# Patient Record
Sex: Female | Born: 1960 | Hispanic: No | Marital: Married | Smoking: Never smoker
Health system: Southern US, Community
[De-identification: ages and names within clinical notes are randomized; demographics above are authoritative.]

## PROBLEM LIST (undated history)

## (undated) DIAGNOSIS — I10 Essential (primary) hypertension: Secondary | ICD-10-CM

## (undated) DIAGNOSIS — E119 Type 2 diabetes mellitus without complications: Secondary | ICD-10-CM

---

## 2002-04-27 ENCOUNTER — Encounter: Payer: Self-pay | Admitting: *Deleted

## 2002-04-27 ENCOUNTER — Encounter: Admission: RE | Admit: 2002-04-27 | Discharge: 2002-04-27 | Payer: Self-pay | Admitting: *Deleted

## 2002-05-05 ENCOUNTER — Encounter: Admission: RE | Admit: 2002-05-05 | Discharge: 2002-05-05 | Payer: Self-pay | Admitting: *Deleted

## 2002-05-05 ENCOUNTER — Encounter: Payer: Self-pay | Admitting: *Deleted

## 2003-09-25 ENCOUNTER — Other Ambulatory Visit: Admission: RE | Admit: 2003-09-25 | Discharge: 2003-09-25 | Payer: Self-pay | Admitting: Family Medicine

## 2003-09-28 ENCOUNTER — Emergency Department (HOSPITAL_COMMUNITY): Admission: EM | Admit: 2003-09-28 | Discharge: 2003-09-28 | Payer: Self-pay | Admitting: Emergency Medicine

## 2003-10-08 ENCOUNTER — Ambulatory Visit (HOSPITAL_COMMUNITY): Admission: RE | Admit: 2003-10-08 | Discharge: 2003-10-08 | Payer: Self-pay | Admitting: Family Medicine

## 2003-10-10 ENCOUNTER — Encounter: Admission: RE | Admit: 2003-10-10 | Discharge: 2003-10-10 | Payer: Self-pay | Admitting: Surgery

## 2003-11-05 ENCOUNTER — Observation Stay (HOSPITAL_COMMUNITY): Admission: RE | Admit: 2003-11-05 | Discharge: 2003-11-06 | Payer: Self-pay | Admitting: General Surgery

## 2003-11-05 ENCOUNTER — Encounter (INDEPENDENT_AMBULATORY_CARE_PROVIDER_SITE_OTHER): Payer: Self-pay | Admitting: Specialist

## 2003-12-14 ENCOUNTER — Encounter: Admission: RE | Admit: 2003-12-14 | Discharge: 2003-12-14 | Payer: Self-pay | Admitting: Family Medicine

## 2004-07-08 ENCOUNTER — Ambulatory Visit: Payer: Self-pay | Admitting: Family Medicine

## 2004-07-29 ENCOUNTER — Encounter (INDEPENDENT_AMBULATORY_CARE_PROVIDER_SITE_OTHER): Payer: Self-pay | Admitting: *Deleted

## 2004-07-29 ENCOUNTER — Inpatient Hospital Stay (HOSPITAL_COMMUNITY): Admission: RE | Admit: 2004-07-29 | Discharge: 2004-08-01 | Payer: Self-pay | Admitting: Urology

## 2004-10-27 ENCOUNTER — Ambulatory Visit: Payer: Self-pay | Admitting: Family Medicine

## 2004-11-03 ENCOUNTER — Ambulatory Visit: Payer: Self-pay | Admitting: Family Medicine

## 2004-12-30 ENCOUNTER — Ambulatory Visit: Payer: Self-pay | Admitting: Family Medicine

## 2005-01-06 ENCOUNTER — Ambulatory Visit: Payer: Self-pay | Admitting: Family Medicine

## 2005-07-06 ENCOUNTER — Ambulatory Visit: Payer: Self-pay | Admitting: Family Medicine

## 2005-09-02 ENCOUNTER — Encounter: Admission: RE | Admit: 2005-09-02 | Discharge: 2005-09-02 | Payer: Self-pay | Admitting: Rheumatology

## 2005-10-16 ENCOUNTER — Ambulatory Visit: Payer: Self-pay | Admitting: Family Medicine

## 2005-10-19 ENCOUNTER — Encounter: Admission: RE | Admit: 2005-10-19 | Discharge: 2005-10-19 | Payer: Self-pay | Admitting: Neurosurgery

## 2005-11-09 ENCOUNTER — Encounter: Admission: RE | Admit: 2005-11-09 | Discharge: 2005-11-09 | Payer: Self-pay | Admitting: Neurosurgery

## 2006-02-12 ENCOUNTER — Encounter: Admission: RE | Admit: 2006-02-12 | Discharge: 2006-02-12 | Payer: Self-pay | Admitting: Family Medicine

## 2006-05-17 ENCOUNTER — Encounter: Admission: RE | Admit: 2006-05-17 | Discharge: 2006-05-17 | Payer: Self-pay | Admitting: Neurosurgery

## 2006-06-11 ENCOUNTER — Ambulatory Visit: Payer: Self-pay | Admitting: Internal Medicine

## 2006-06-18 ENCOUNTER — Ambulatory Visit: Payer: Self-pay | Admitting: Family Medicine

## 2006-09-28 ENCOUNTER — Ambulatory Visit: Payer: Self-pay | Admitting: Family Medicine

## 2007-01-18 ENCOUNTER — Ambulatory Visit: Payer: Self-pay | Admitting: Family Medicine

## 2007-02-21 ENCOUNTER — Encounter: Admission: RE | Admit: 2007-02-21 | Discharge: 2007-02-21 | Payer: Self-pay | Admitting: Family Medicine

## 2007-06-06 DIAGNOSIS — I10 Essential (primary) hypertension: Secondary | ICD-10-CM | POA: Insufficient documentation

## 2007-06-06 DIAGNOSIS — F329 Major depressive disorder, single episode, unspecified: Secondary | ICD-10-CM | POA: Insufficient documentation

## 2007-06-06 DIAGNOSIS — R32 Unspecified urinary incontinence: Secondary | ICD-10-CM | POA: Insufficient documentation

## 2007-07-13 ENCOUNTER — Ambulatory Visit: Payer: Self-pay | Admitting: Family Medicine

## 2007-07-13 LAB — CONVERTED CEMR LAB
AST: 31 units/L (ref 0–37)
Albumin: 4.1 g/dL (ref 3.5–5.2)
Basophils Absolute: 0 10*3/uL (ref 0.0–0.1)
Blood in Urine, dipstick: NEGATIVE
Chloride: 102 meq/L (ref 96–112)
Cholesterol: 188 mg/dL (ref 0–200)
Eosinophils Absolute: 0 10*3/uL (ref 0.0–0.6)
GFR calc non Af Amer: 72 mL/min
Glucose, Urine, Semiquant: NEGATIVE
HCT: 41.1 % (ref 36.0–46.0)
HDL: 45.7 mg/dL (ref >39.0)
MCHC: 33.8 g/dL (ref 30.0–36.0)
MCV: 91.7 fL (ref 78.0–100.0)
Neutrophils Relative %: 66.8 % (ref 43.0–77.0)
Platelets: 249 10*3/uL (ref 150–400)
Protein, U semiquant: NEGATIVE
RBC: 4.48 M/uL (ref 3.87–5.11)
Sodium: 143 meq/L (ref 135–145)
TSH: 0.8 microintl units/mL (ref 0.35–5.50)
Total CHOL/HDL Ratio: 4.1
Triglycerides: 97 mg/dL (ref 0–149)
pH: 6

## 2007-07-20 ENCOUNTER — Ambulatory Visit: Payer: Self-pay | Admitting: Family Medicine

## 2007-07-20 DIAGNOSIS — E782 Mixed hyperlipidemia: Secondary | ICD-10-CM | POA: Insufficient documentation

## 2007-08-17 ENCOUNTER — Telehealth (INDEPENDENT_AMBULATORY_CARE_PROVIDER_SITE_OTHER): Payer: Self-pay | Admitting: *Deleted

## 2007-08-18 ENCOUNTER — Ambulatory Visit: Payer: Self-pay | Admitting: Family Medicine

## 2007-08-18 DIAGNOSIS — J309 Allergic rhinitis, unspecified: Secondary | ICD-10-CM | POA: Insufficient documentation

## 2007-09-30 ENCOUNTER — Telehealth (INDEPENDENT_AMBULATORY_CARE_PROVIDER_SITE_OTHER): Payer: Self-pay | Admitting: *Deleted

## 2007-11-14 ENCOUNTER — Observation Stay (HOSPITAL_COMMUNITY): Admission: EM | Admit: 2007-11-14 | Discharge: 2007-11-15 | Payer: Self-pay | Admitting: Emergency Medicine

## 2007-11-14 ENCOUNTER — Ambulatory Visit: Payer: Self-pay | Admitting: Infectious Diseases

## 2007-11-15 ENCOUNTER — Ambulatory Visit: Payer: Self-pay | Admitting: Internal Medicine

## 2007-11-17 ENCOUNTER — Encounter: Payer: Self-pay | Admitting: Family Medicine

## 2007-11-17 ENCOUNTER — Ambulatory Visit: Payer: Self-pay

## 2007-11-24 ENCOUNTER — Telehealth: Payer: Self-pay | Admitting: Family Medicine

## 2007-12-07 ENCOUNTER — Telehealth: Payer: Self-pay | Admitting: Family Medicine

## 2007-12-26 ENCOUNTER — Telehealth: Payer: Self-pay | Admitting: Family Medicine

## 2008-02-01 ENCOUNTER — Encounter: Admission: RE | Admit: 2008-02-01 | Discharge: 2008-02-01 | Payer: Self-pay | Admitting: Neurosurgery

## 2008-03-14 ENCOUNTER — Encounter: Admission: RE | Admit: 2008-03-14 | Discharge: 2008-03-14 | Payer: Self-pay | Admitting: Family Medicine

## 2008-04-06 ENCOUNTER — Encounter: Admission: RE | Admit: 2008-04-06 | Discharge: 2008-04-06 | Payer: Self-pay | Admitting: Neurosurgery

## 2009-03-26 ENCOUNTER — Encounter: Admission: RE | Admit: 2009-03-26 | Discharge: 2009-03-26 | Payer: Self-pay | Admitting: Internal Medicine

## 2009-03-30 ENCOUNTER — Ambulatory Visit: Payer: Self-pay | Admitting: Diagnostic Radiology

## 2009-03-30 ENCOUNTER — Emergency Department (HOSPITAL_BASED_OUTPATIENT_CLINIC_OR_DEPARTMENT_OTHER): Admission: EM | Admit: 2009-03-30 | Discharge: 2009-03-30 | Payer: Self-pay | Admitting: Emergency Medicine

## 2009-12-03 ENCOUNTER — Encounter: Admission: RE | Admit: 2009-12-03 | Discharge: 2009-12-03 | Payer: Self-pay | Admitting: Neurosurgery

## 2010-03-01 IMAGING — MG MM DIGITAL SCREENING BILAT W/ CAD
4 series · 4 of 4 positions shown · non-contrast
Comparison: Prior studies.

DG SCREEN MAMMOGRAM BILATERAL
Bilateral CC and MLO view(s) were taken.
Technologist: Dominika Obregon

DIGITAL SCREENING MAMMOGRAM WITH CAD:

[R CC]
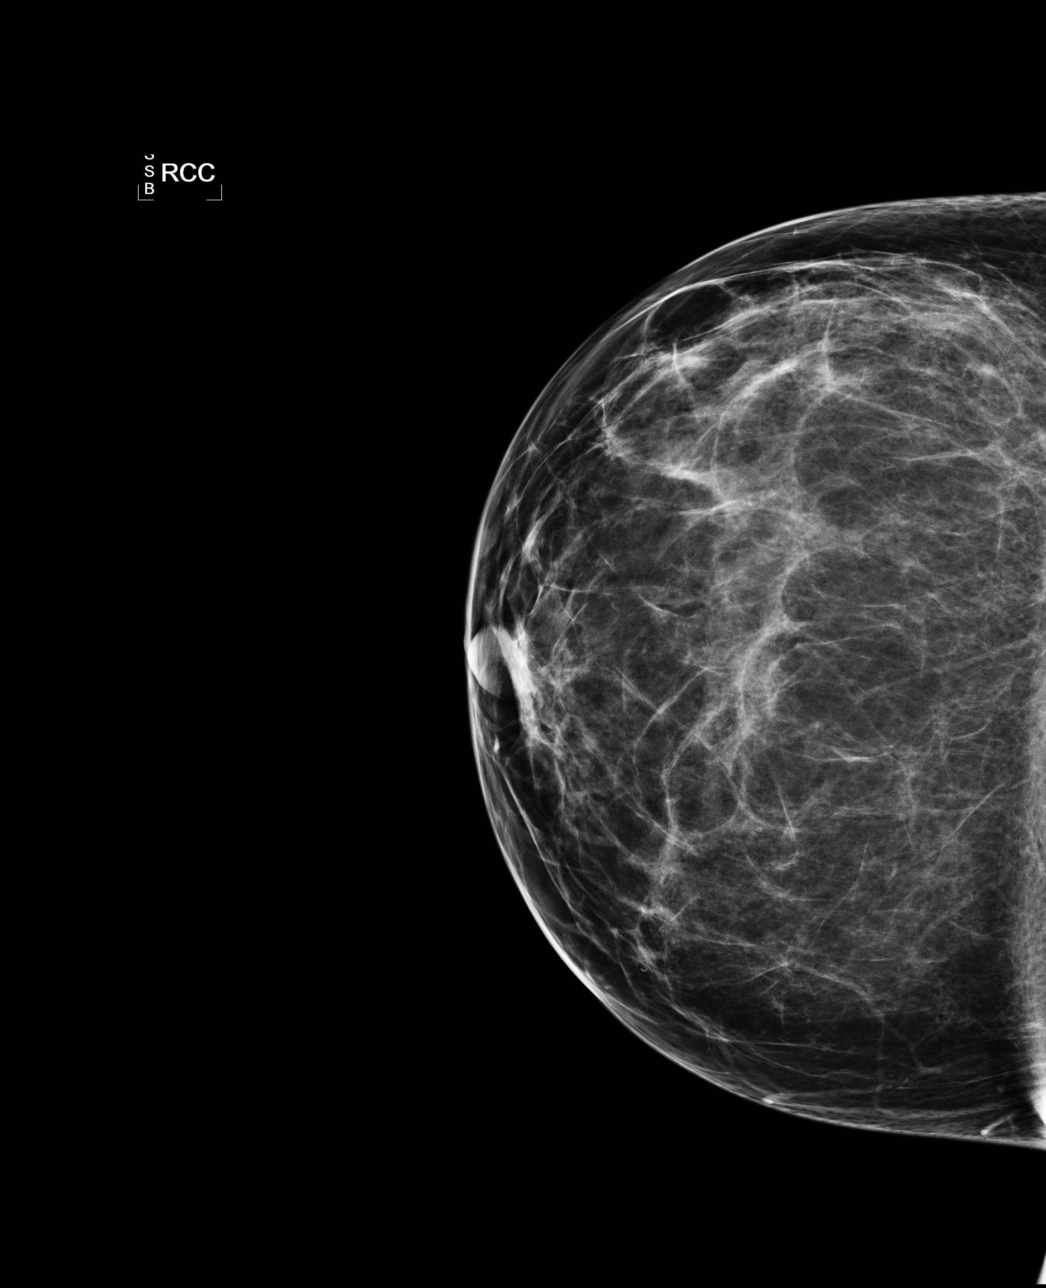

[L CC]
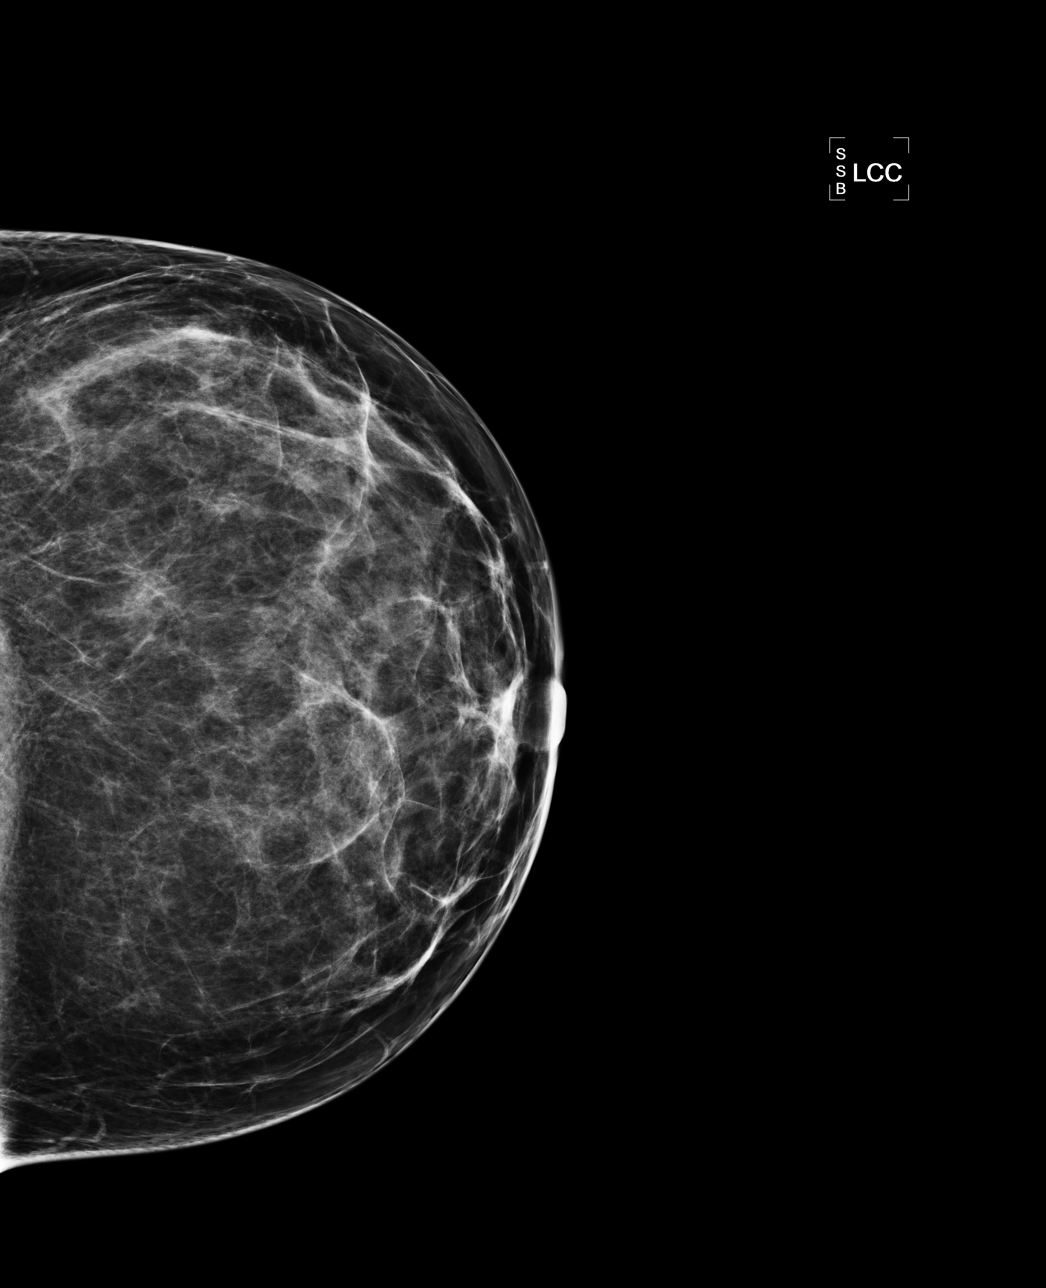

[L MLO]
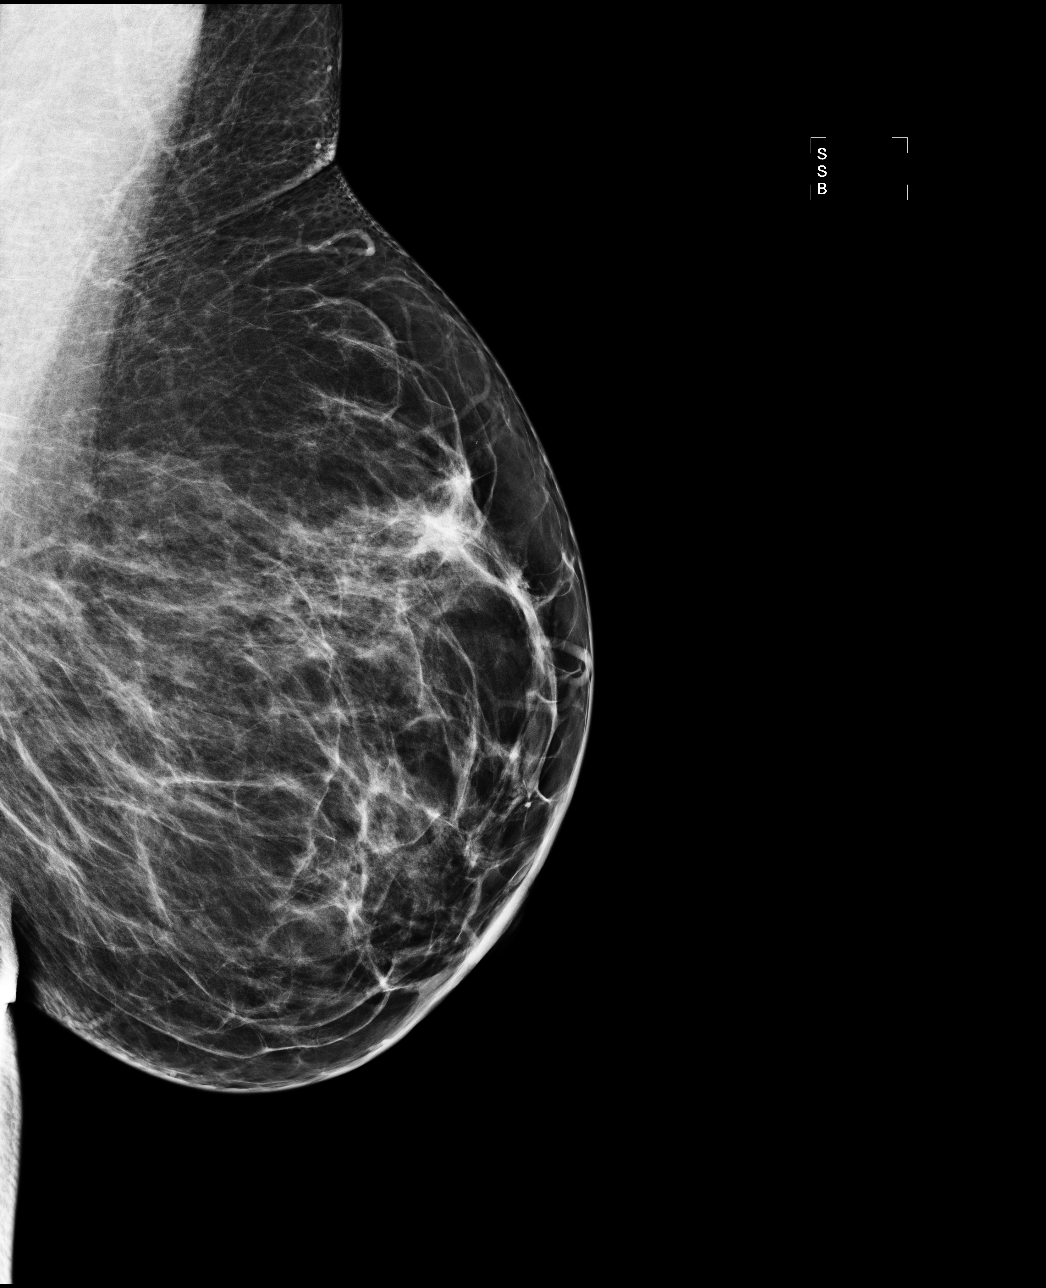

[R MLO]
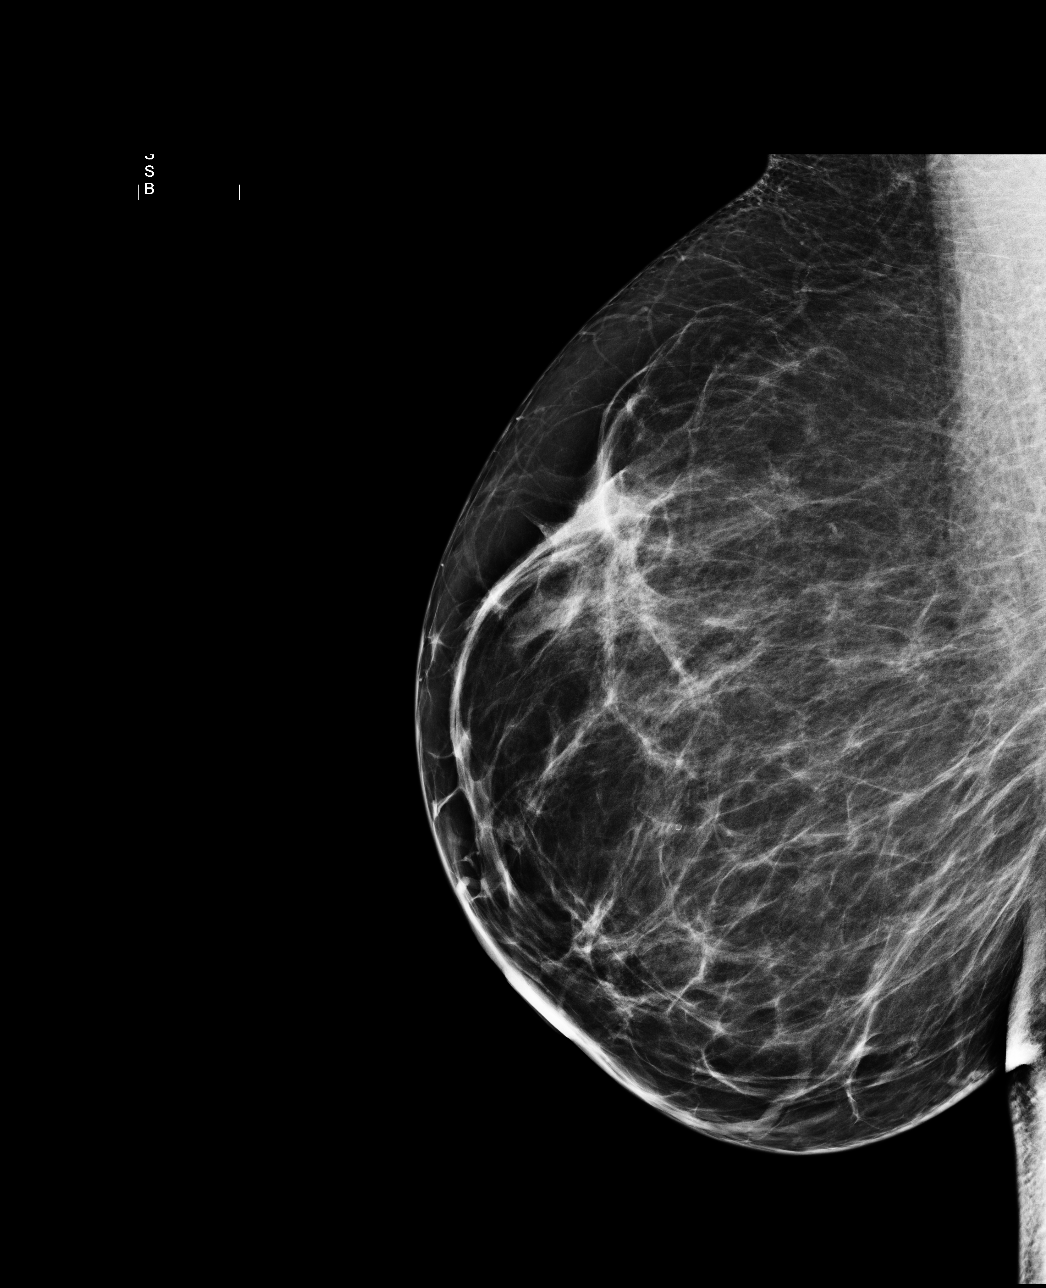

[4 of 4 positions shown; findings below may reference images not displayed]

There are scattered fibroglandular densities.  There is no dominant mass, architectural distortion 
or calcification to suggest malignancy.

Images were processed with CAD.
IMPRESSION: No mammographic evidence of malignancy.  Suggest yearly screening mammography.

A result letter of this screening mammogram will be mailed directly to the patient.

ASSESSMENT: Negative - BI-RADS 1

Screening mammogram in 1 year.
ANALYZED BY COMPUTER AIDED DETECTION , THIS PROCEDURE WAS A DIGITAL MAMMOGRAM.

## 2010-03-05 IMAGING — CR DG CHEST 2V
2 series · 2 of 2 positions shown · non-contrast
Comparison: 11/13/2007

CLINICAL DATA: Abdominal pain.  Fever.  Nausea and vomiting.
Hypertension.

CHEST - 2 VIEW

[w chest pa]
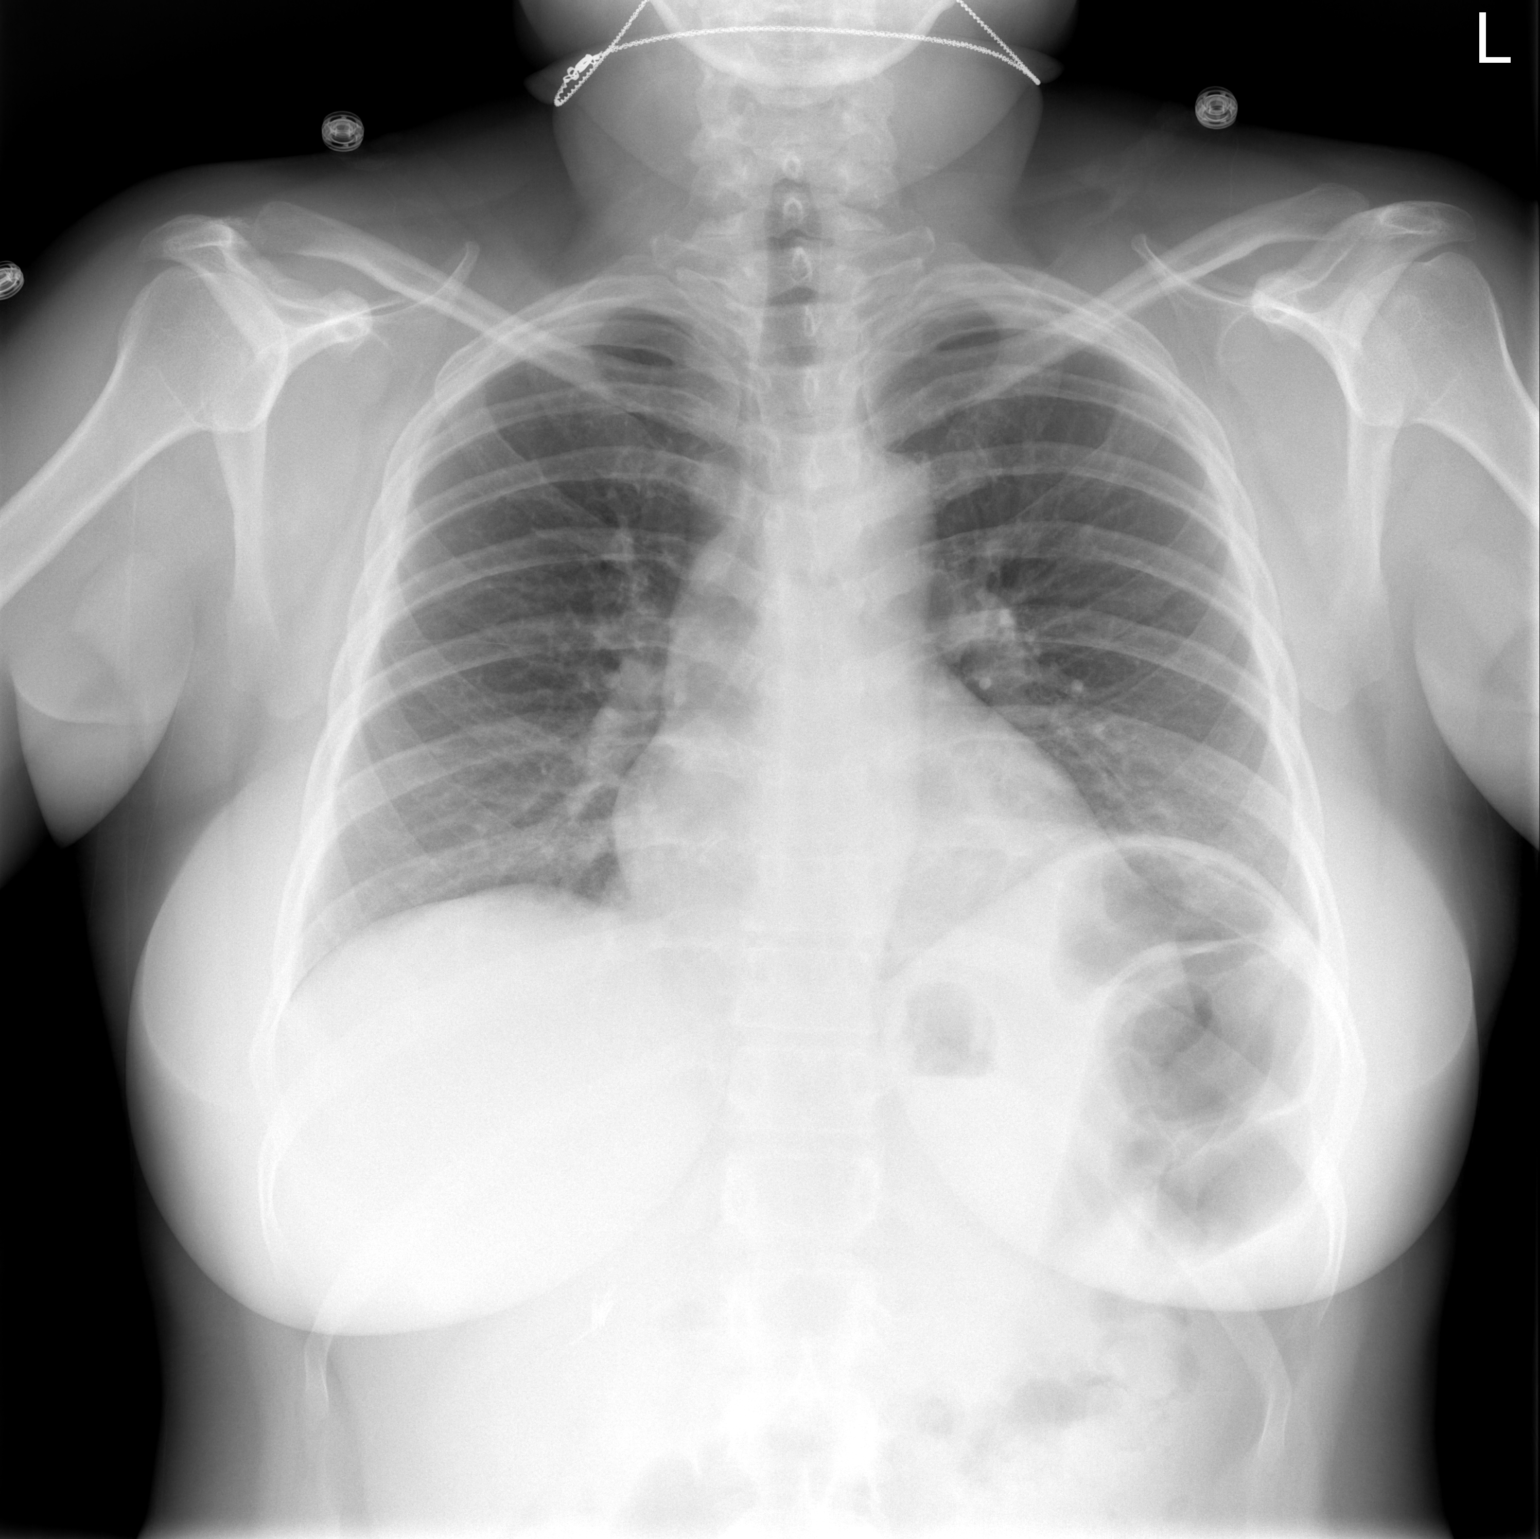

[w chest lat]
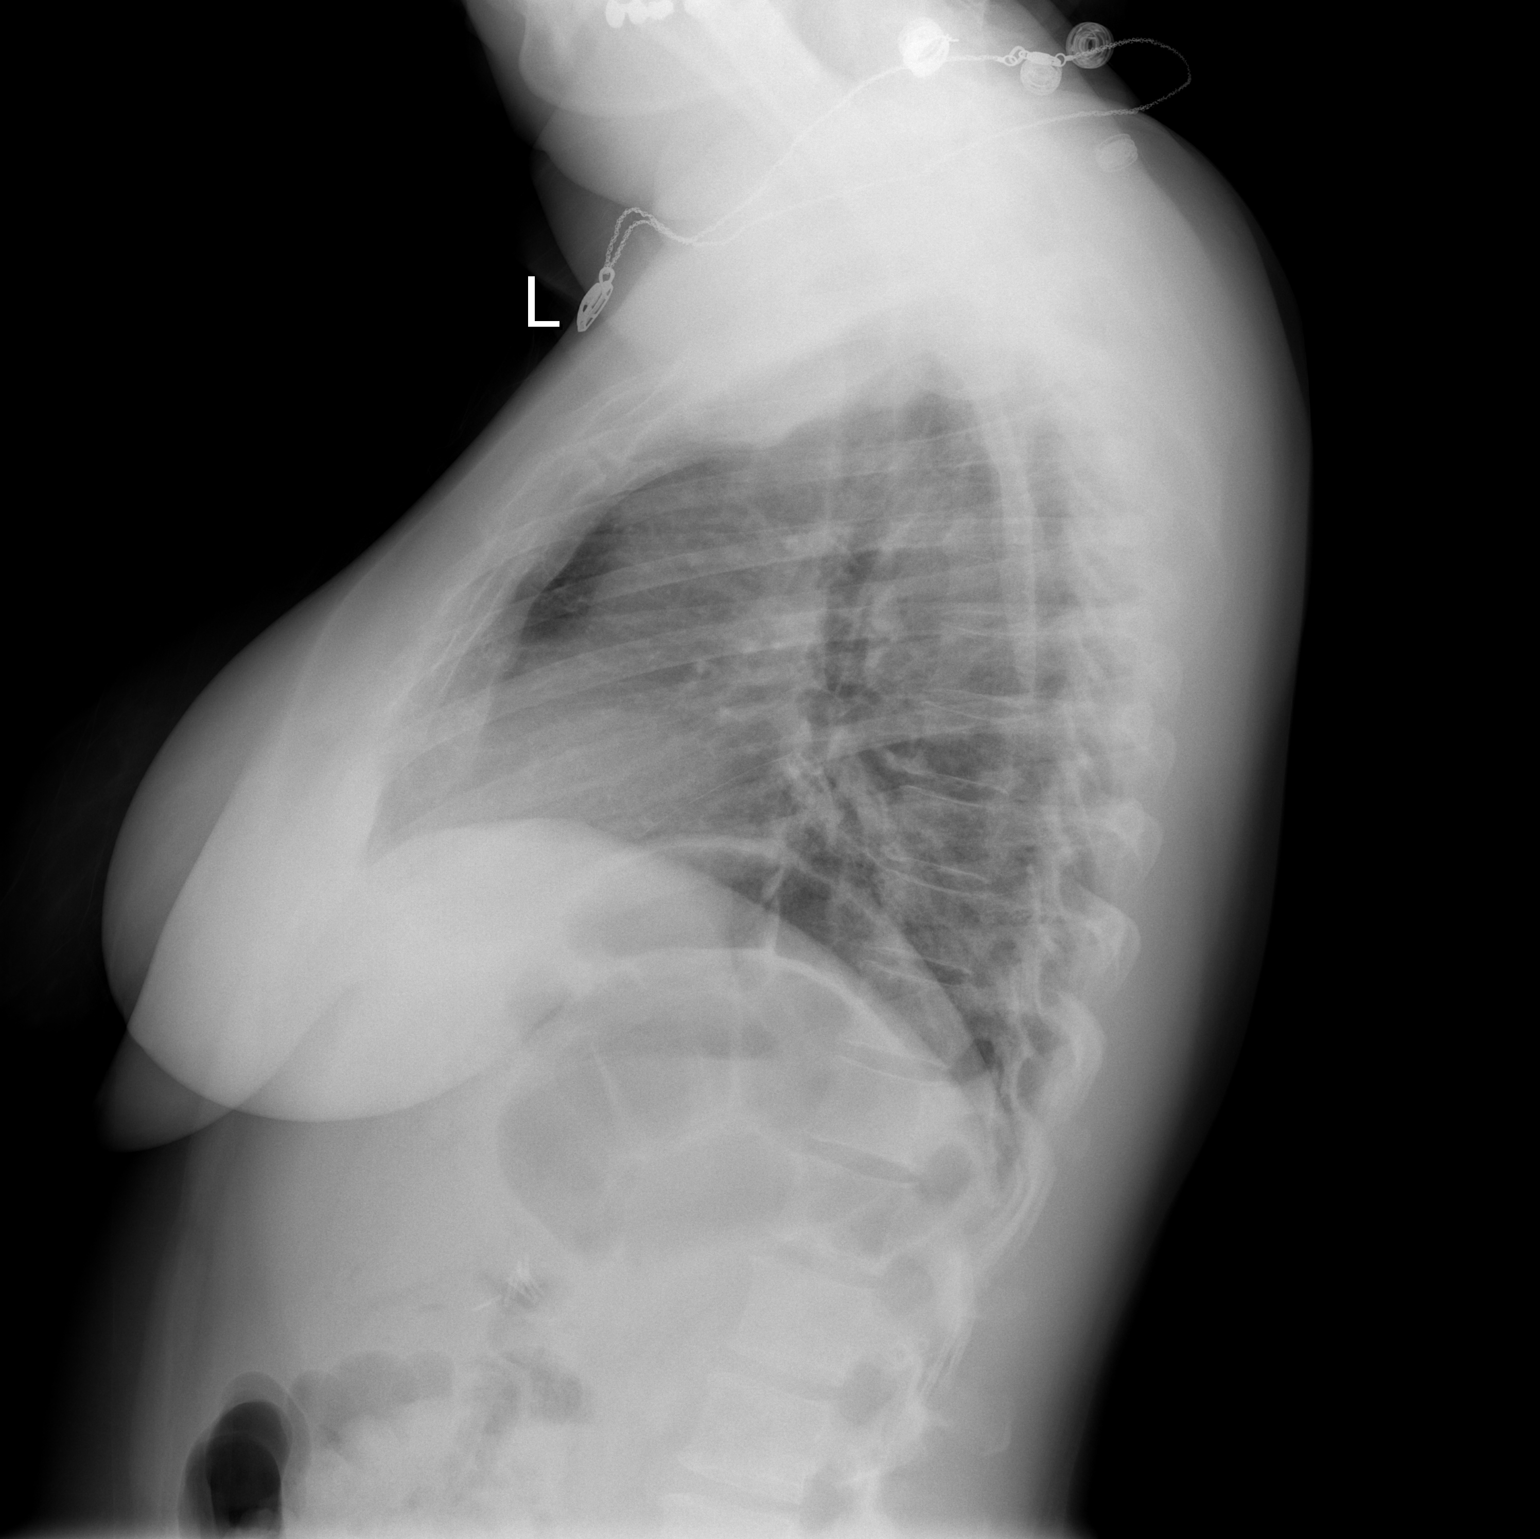

[2 of 2 positions shown; findings below may reference images not displayed]

FINDINGS: Heart size is normal.  There are slightly increased
chronic markings at the lung bases that appear the same as were
seen previously.  No acute infiltrate, mass, effusion or collapse.
Mild inflammation could be hidden amongst the chronic changes at
the bases.
IMPRESSION: Chronically prominent basilar lung markings.  No definite acute
process.  Mild pneumonia could be hidden amongst these chronic
markings at the bases.

## 2010-09-27 ENCOUNTER — Encounter: Payer: Self-pay | Admitting: Family Medicine

## 2010-09-27 ENCOUNTER — Encounter: Payer: Self-pay | Admitting: Obstetrics and Gynecology

## 2010-12-14 LAB — CBC
HCT: 39.7 % (ref 36.0–46.0)
Hemoglobin: 12.2 g/dL (ref 12.0–15.0)
WBC: 5.6 10*3/uL (ref 4.0–10.5)

## 2010-12-14 LAB — URINALYSIS, ROUTINE W REFLEX MICROSCOPIC
Nitrite: POSITIVE — AB
Specific Gravity, Urine: 1.016 (ref 1.005–1.030)
Urobilinogen, UA: 4 mg/dL — ABNORMAL HIGH (ref 0.0–1.0)

## 2010-12-14 LAB — DIFFERENTIAL
Eosinophils Relative: 0 % (ref 0–5)
Lymphocytes Relative: 4 % — ABNORMAL LOW (ref 12–46)
Lymphs Abs: 0.2 10*3/uL — ABNORMAL LOW (ref 0.7–4.0)
Monocytes Absolute: 0 10*3/uL — ABNORMAL LOW (ref 0.1–1.0)
Neutro Abs: 5.4 10*3/uL (ref 1.7–7.7)

## 2010-12-14 LAB — URINE CULTURE

## 2010-12-14 LAB — URINE MICROSCOPIC-ADD ON

## 2010-12-14 LAB — BASIC METABOLIC PANEL
Calcium: 8.9 mg/dL (ref 8.4–10.5)
GFR calc Af Amer: >60 mL/min (ref >60)
GFR calc non Af Amer: >60 mL/min (ref >60)
Potassium: 4.2 mEq/L (ref 3.5–5.1)
Sodium: 140 mEq/L (ref 135–145)

## 2010-12-14 LAB — PREGNANCY, URINE: Preg Test, Ur: NEGATIVE

## 2011-01-20 NOTE — H&P (Signed)
NAME:  [name], [name] NO.:  [id number]   MEDICAL RECORD NO.:  [id number]          PATIENT TYPE:  EMS   LOCATION:  MAJO                         FACILITY:  [facility]   PHYSICIAN:  Mick Sell, MD DATE OF BIRTH:  1960-11-11   DATE OF ADMISSION:  11/14/2007  DATE OF DISCHARGE:                              HISTORY & PHYSICAL   CHIEF COMPLAINT:  Chest pain.   HISTORY OF PRESENT ILLNESS:  This is a pleasant 50 year old female with  a history of hypertension, hyperlipidemia, GERD, depression, as well as  fibromyalgia, who presented via EMS to the hospital after having onset  of severe 10/10 substernal chest pain around 9 p.m. this evening.  She  said the pain occurred while she was getting ready for bed, but not  necessarily associated with exertion.  She described it as burning right  in the middle of her chest with some right arm weakness and numbness.  She has had some associated nausea, vomited once and was diaphoretic and  clammy.  She initially thought it was GERD and took some of her  medications including Pepto-Bismol without relief.  She called EMS and  was given nitroglycerin x2 in the ambulance with full relief of the  pain.  She has not had any palpitations or any severe shortness of  breath associated with it.  She has no history of lower extremity  swelling or tenderness.  No fevers, chills or cough.   PAST MEDICAL HISTORY:  1. Hypertension.  2. Hypercholesterolemia.  3. Fibromyalgia.  4. GERD.  5. History of duodenal ulcer 19 years ago.   ALLERGIES:  No known drug allergies.   MEDICATIONS:  1. Atenolol 50 once a day.  2. Detrol 4 once a day.  3. Zoloft 100 once a day.  4. Simvastatin she thinks 40 once a day.  5. Lyrica 150 once a day.  6. Aspirin 81 mg once a day.  7. Prilosec OTC.   SOCIAL HISTORY:  The patient is married.  Lives with her husband and  child.  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  She is a  nonsmoker.  She drinks about two glasses of wine a day.   FAMILY HISTORY:  Negative for early coronary disease.  Her father died  of pancreatic cancer, and her mother is alive and well at 46.   PHYSICAL EXAMINATION:  GENERAL:  The patient is a slightly overweight  female in no acute distress.  Although, she does appear tired.  VITAL SIGNS:  Temperature 97.5, pulse 68, blood pressure 124/80,  respirations 18, saturation 98% on room air.  HEENT:  Pupils equal, round and reactive to light and accommodation.  Extraocular movements intact.  Sclerae anicteric.  Oropharynx clear.  NECK:  Supple.  HEART:  Regular rate and rhythm.  LUNGS:  Clear to auscultation.  ABDOMEN:  Soft, nontender, nondistended, no hepatosplenomegaly.  EXTREMITIES:  No cyanosis, clubbing or edema.  No tenderness.  Negative  Homan's sign.  NEUROLOGICAL:  Alert and oriented x3 with a grossly nonfocal  neurological exam.   LABORATORY DATA:  The patient had blood work done in the ER with sodium  135, potassium 3.1, chloride 102, BUN 13, creatinine 0.9.  Troponin less  than 0.05 x2.  CK MB of less than 1 x2, myoglobin 25.7 and 31.2.  The  patient also had an EKG done which showed normal sinus rhythm with no ST-  T wave changes.  Chest x-ray showed low lung volumes with  cardiopulmonary vasculature and mild bibasilar subsegmental atelectasis,  otherwise, negative.   ASSESSMENT:  1. This is a 50 year old with chest pain who has risk factors of      hypercholesterolemia, hypertension who had chest pain that was      burning in nature, severe, associated with nausea, vomiting,      diaphoresis and was relieved with sublingual nitroglycerin x2 via      EMS.  Given her risk factors and her somewhat atypical chest pain      syndrome, will admit her for rule out MI.  She has had two negative      sets of enzymes so far.  Will repeat another one and consult      cardiology for further evaluation with probable Myoview stress       test.  2. Will also treat her for her history of GERD and prior history of      duodenal ulcers with Nexium as well as Maalox and GI cocktail as      needed.  3. Will also continue the patient on her outpatient medications      including Atenolol and aspirin.  4. Hyperlipidemia.  Will check her lipid panel and continue her on      Simvastatin.      Mick Sell, MD  Electronically Signed     DPF/MEDQ  D:  11/14/2007  T:  11/14/2007  Job:  [id number]

## 2011-01-20 NOTE — Discharge Summary (Signed)
NAME:  [name], HELLMER NO.:  [id number]   MEDICAL RECORD NO.:  [id number]          PATIENT TYPE:  OBV   LOCATION:  2010                         FACILITY:  [facility]   PHYSICIAN:  Raenette Rover. Felicity Coyer, MDDATE OF BIRTH:  12-19-1960   DATE OF ADMISSION:  11/14/2007  DATE OF DISCHARGE:  11/15/2007                               DISCHARGE SUMMARY   DISCHARGE DIAGNOSES:  1. Substernal chest pain, resolved. Suspect gastrointestinal in      origin. See details below.  2. Hypertension.  3. Dyslipidemia.  4. Gastroesophageal reflux disease.  5. History of fibromyalgia.  6. Right eye stye.   DISCHARGE MEDICATIONS:  1. Prilosec over-the-counter p.o. b.i.d.  2. All of the medications listed prior to admission.      a.     Atenolol 50 mg once daily.      b.     Zocor 40 mg once daily.      c.     Zoloft 100 mg daily.      d.     Lyrica 150 mg daily.      e.     Aspirin 81 mg daily.      f.     Detrol LA 4 mg daily.  3. The patient was also given a supply of Cipro eye drops to apply 2      drops to the affected right eye 4 times daily for the next 2 days      for prevention of secondary infection in the right eye stye.   HOSPITAL FOLLOWUP:  Scheduled for Thursday, [facility], March  12th at 10 a.m. for a treadmill stress test for further risk  stratification. Presuming this test to be negative, the patient will  call her primary care physician, Dr. Alonza Smoker, for re-evaluation of  symptoms and consideration and referral to GI based on these results and  treatment with twice daily proton pump inhibitor.   CONDITION ON DISCHARGE:  Medically stable. No recurrence of chest pain.   HOSPITAL COURSE BY PROBLEM:  Chest pain. The patient is a 50 year old  woman, reasonably healthy but with several cardiac risk factors as  listed above which have been good control, who came to the emergency  room with substernal tightening beneath her chest which she felt like  was from her  esophagus once again. Initial evaluation in the emergency  room was negative, but due to her cardiac risk factors including  hypertension and dyslipidemia as well as relieved by nitroglycerin she  was referred for further observation for telemetry and cycling  cardiac  enzymes to rule out acute myocardial infarction. These were negative.  Due to scheduling complications, she was not arranged for inpatient  Cardiolite but instead has opted for outpatient stress testing to  further exclude cardiac nature which has been scheduled for Thursday,  March 12th as listed above. Given the relief of the major of her  symptoms with nitroglycerin as well as history of reflux, suspect her  symptoms are gastrointestinal in origin, perhaps related to esophageal  spasm. We have encouraged twice daily  Prilosec over the counter for  treatment  pending any further workup results of Cardiolite. If cardiac evaluation  is negative, referral back to primary M.D. for consideration and  gastrointestinal followup if symptoms persist would be appropriate but  again defer to primary M.D. in this regard. The patient will take  medications  that are as prior to admission without change.      Valerie A. Felicity Coyer, MD  Electronically Signed     VAL/MEDQ  D:  11/15/2007  T:  11/15/2007  Job:  [id number]

## 2011-01-23 NOTE — Discharge Summary (Signed)
NAMEMERITA, HAWKS NO.:  [id number]   MEDICAL RECORD NO.:  [id number]          PATIENT TYPE:  INP   LOCATION:  0340                         [facility]ACILITY:  [facility]   PHYSICIAN:  Richardean Sale, M.D.   DATE OF BIRTH:  [birth year]   DATE OF ADMISSION:  07/29/2004  DATE OF DISCHARGE:  08/01/2004                                 DISCHARGE SUMMARY   ADMISSION DIAGNOSES:  1.  Symptomatic uterine fibroids.  2.  Stress urinary incontinence.   DISCHARGE DIAGNOSES:  1.  Symptomatic uterine fibroids.  2.  Stress urinary incontinence.   PROCEDURES:  1.  Total abdominal hysterectomy, performed on July 29, 2004, by Dr.      Richardean Sale.  2.  A SPARC suburethral sling, performed on July 29, 2004, by Dr. Bjorn Pippin.   HOSPITAL COURSE/ HISTORY OF PRESENT ILLNESS:  This is a 50 year old gravida  2, para 1-0-1-1, white female with symptomatic uterine fibroids and stress  urinary incontinence who underwent a total abdominal hysterectomy and SPARC  suburethral sling procedure, on July 29, 2004.   The patient's postoperative course was complicated by urinary retention.  On  postoperative day number two, the patient was having difficulty voiding.  Postvoid residual revealed a residual of 900 cc of urine in the bladder.  The patient subsequently was re-catheterized and was discharged to home with  a Foley in place.  The patient had a low-grade fever on postoperative day  number one, which resolved spontaneously.  On postoperative day number  three, she was tolerating a regular diet, was ambulating without difficulty,  passing flatus, and her pain was controlled with oral pain medication.  She  was discharged to home on August 01, 2004, on postoperative day number  three, in good condition, with a Foley catheter in place, to treat.   LABORATORY DATA:  Preoperative hemoglobin 13.8, white count 5.3.  Postop day  one, hemoglobin 10.9, white count 9.4.  Urine culture,  performed on July 30, 2004, negative.   DISPOSITION:  To home.   CONDITION:  Good.   FOLLOW UP:  The patient will follow up on August 04, 2004 with Dr. Bjorn Pippin, for further recommendations regarding bladder care.  She will follow  up in four to six weeks with myself, Dr. Richardean Sale, for a routine  postoperative visit.   MEDICATIONS:  Percocet 1 to 2 tablets p.o. q.4 to 6 h. p.r.n. pain, Motrin  800 mg p.o. q.8 h. p.r.n. pain.   INSTRUCTIONS:  No heavy lifting for four weeks.  No driving for two weeks.  Nothing in the vagina for six weeks.  Instructions reviewed with the patient  prior to discharge.      JW/MEDQ  D:  08/04/2004  T:  08/04/2004  Job:  [id number]

## 2011-01-23 NOTE — Op Note (Signed)
[name], [name] NO.:  [id number]   MEDICAL RECORD NO.:  [id number]          PATIENT TYPE:  INP   LOCATION:  X004                         FACILITY:  [facility]   PHYSICIAN:  Excell Seltzer. Annabell Howells, M.D.    DATE OF BIRTH:  08-Nov-1960   DATE OF PROCEDURE:  07/29/2004  DATE OF DISCHARGE:                                 OPERATIVE REPORT   PROCEDURE:  SPARC sling.   PREOPERATIVE DIAGNOSIS:  Stress incontinence.   POSTOPERATIVE DIAGNOSIS:  Stress incontinence.   SURGEON:  Dr. Bjorn Pippin   ANESTHESIA:  General.   DRAINS:  Foley catheter and vaginal pack.   COMPLICATIONS:  None.   INDICATIONS:  Ms. [name] is a 50 year old black female with stress urinary  incontinence, who is to undergo a hysterectomy for fibroids by Dr. Richardean Sale.  She has elected to undergo [facility] sling for treatment of her stress  incontinence.   FINDINGS AND PROCEDURE:  The patient had undergone an abdominal hysterectomy  for large fibroids.  She was in the lithotomy position and had been prepped  vaginally and abdominally.  The stirrups were elevated to increase the  lithotomy position.  A vaginal retractor was placed.  The anterior vaginal  wall over the mid urethra was infiltrated with 8 mL of 1% lidocaine with  epinephrine.  A midline incision was made approximately 2-3 cm in length  over the mid urethra.  The mucosa was elevated off the pubourethral fascia  for about 2 cm on each side to allow tip of the finger within the incision.  Incisions were then made over the pubis approximately 2 cm lateral to the  midline, one on each side transversely, and the fat was spread to the fascia  with a hemostat.  The [facility] kit was brought onto the field, and the right  trocar was then passed from the abdominal incision to the top of the pubis  where it was walked along the back of the pubis until the tip could be  palpated by the finger in the right vaginal incision.  The trocar was then  passed through  into the vaginal incision.  This was then repeated on the  left side.  Cystoscopy was then performed with the 22 [place] scope and 70-  degree lens.  Examination revealed no bladder wall abnormalities, no  evidence of bladder wall perforation by the trocar.  The bladder was then  drained.  The [facility] mesh was snapped to the trocars and drawn back up into  the abdominal incision.  Repeat cystoscopy once again revealed no bladder  perforation or injury.  At this point, the [facility] mesh was tensioned  appropriately, and the sleeves were removed.  The Foley catheter was  reinserted, and the sling tension was once again adjusted until appropriate.  Once the appropriately loose tension had been achieved, the anterior vaginal  wall was closed with a running locked 2-0 Vicryl suture, and the abdominal  ends of the sling mesh were trimmed, allowed to drop back into the  subcutaneous tissue.  The abdominal incisions were closed with  Dermabond.  A  2-inch Iodoform vaginal pack  was placed.  The Foley was placed to straight drainage.  Dressings were  applied to her abdominal hysterectomy incision.  She was taken down from  lithotomy position.  Her anesthetic was reversed.  She was moved to the  recovery room in stable condition.  There were no complications.      JJW/MEDQ  D:  07/29/2004  T:  07/29/2004  Job:  [id number]   cc:   Richardean Sale, M.D.  [address]  [place]  [place] [address]  Fax: [phone]   Eugenio Hoes. Tawanna Cooler, M.D. [facility]

## 2011-01-23 NOTE — Op Note (Signed)
NAME:  [name], [name] NO.:  [id number]   MEDICAL RECORD NO.:  [id number]          PATIENT TYPE:  INP   LOCATION:  0340                         FACILITY:  [facility]   PHYSICIAN:  Richardean Sale, M.D.   DATE OF BIRTH:  [birth year]   DATE OF PROCEDURE:  07/29/2004  DATE OF DISCHARGE:                                 OPERATIVE REPORT   PREOPERATIVE DIAGNOSES:  1.  Symptomatic uterine fibroids.  2.  Pelvic pain.   POSTOPERATIVE DIAGNOSES:  1.  Symptomatic uterine fibroids.  2.  Pelvic pain.   PROCEDURE:  Attempted laparoscopic vaginal hysterectomy converted to total  abdominal hysterectomy.   SURGEON:  Richardean Sale, M.D.   ASSISTANT:  Genia Del, M.D.   ANESTHESIA:  General endotracheal.   COMPLICATIONS:  None.   ESTIMATED BLOOD LOSS:  400 cc.   URINE OUTPUT:  200 cc clear urine at the end of the procedure.   FINDINGS:  Significant scarring from prior laparoscopic cholecystectomy and  bilateral tubal ligation. Enlarged fibroid uterus, approximately 12 weeks  size with largest fibroid estimated at 7 cm, arising from the lower uterine  segment on the right side and extending into the right broad ligament.  Normal adnexa bilaterally.   INDICATIONS:  This is a 50 year old gravida 2, para 1-0-1-1 female who has  had progressively heavier and more painful periods over the past 2 to 3  years. The patient has a history of hypertension and has been unable to take  oral contraceptive pills secondary to the need for antihypertensive  medications. The patient presents today for definitive surgical management  with hysterectomy. Prior to the procedure, we discussed possibility of using  Lupron to decrease the size of the uterus and attempt to facilitate a  vaginal approach. The patient decline therapy with Lupron.   Prior to the procedure, we discussed the risks, benefits, and alternatives  of hysterectomy, reviewed risk of hemorrhage requiring transfusion,  infection which cause intra-abdominal scarring and could prolong  hospitalization or cause pain in the future, possibility of injury to the  bowel or the bladder or the ureters or other intra-abdominal organs which  could prolong her hospitalization or require additional surgery, possibility  of DVT, pulmonary embolus, anesthetic related complications, and even death.  The patient voiced understanding of the risks and agrees to proceed.  Informed consent was obtained before proceeding to the OR and all questions  were answered.   PROCEDURE:  The patient was taken to the operating room where she was then  prepped and draped in the usual sterile fashion and placed in the dorsal  lithotomy position. Foley catheter was placed to drain the bladder. Bimanual  exam was performed which confirmed the presence of an enlarged fibroid  uterus. It was mobile. When a tenaculum was placed on the cervix, the cervix  was displaced inferiorly to the mid portion of the vagina. Given the good  descent of the cervix and mobility of the uterus, decision was made to  proceed with attempted vaginal hysterectomy; given the patient's complaint  of significant pelvic pain and her prior surgeries,  laparoscopic approach  was used. Sterile gown and gloves were then changed, and attention was then  turned to the patient's abdomen. Three cc of 0.25% plain Marcaine were then  injected into the infraumbilical area. The patient's previous scar was used  to create a 10-mm skin incision. This was carried down to the fascia with  some difficulty due to previous scarring. Once the fascia was identified, it  was grasped between 2 Kocher clamps, elevated, and incised sharply. The  fascial incision was then extended, and a finger was placed into the fascial  incision to evaluate for any adhesions. There was an area of thickness on  the right side of the incision which was suspicious for adhesive disease.  Given the patient's  habitus, there was a significant amount of subcutaneous  adipose tissue as well as preperitoneal fat. The Hasson trocar and sleeve  were introduced into the fascial incision, and the laparoscope was  introduced, but the peritoneum was unable to be entered safely, and the  laparoscopic portion of the procedure was terminated. The fascial incision  was closed with a purse-string Vicryl suture, and the skin was closed with a  4-0 Vicryl subcuticular stitch. At this point, attention was then turned to  the abdominal approach for hysterectomy. The tenaculum that had been placed  on the cervix at the start of the procedure was removed. Ten cc of 0.25%  plain Marcaine were then injected into the area where the incision was then  to be made. A Pfannenstiel skin incision was then made with a scalpel, and  this was carried down sharply to the fascia. The fascia was then incised in  the midline, and the incision was extended laterally with the Mayo scissors.  The superior and inferior aspects of the fascia incision were then grasped  with Kocher clamps, elevated, and the underlying rectus muscles dissected  off with both sharp and blunt dissection. The muscles were then separated in  the midline. The peritoneum was then identified, grasped between 2  hemostats, and entered sharply with Metzenbaum scissors. This incision was  then extended superiorly and inferiorly with good visualization of the  bladder. The Balfour retractor was then placed into the incision, and the  bowel was packed away with moist laparotomy sponges. Inspection and  palpation of the uterus revealed an enlarged 12-week size uterus with  multiple uterine fibroids, the largest of which was approximately 6 to 7 cm  and was extending from the right side of the uterus into the right broad  ligament and significantly distorting the anatomy. The ovaries were  inspected and were normal. The fallopian tubes showed evidence of prior tubal  ligation but were otherwise normal. The previously made fascial  incision was palpated intra-abdominally and was closed. There were dense  adhesions just superior to this fascial incision and also on the right side,  but these were unable to be taken down due to lack of visualization. At this  point, the corners of the uterus were then grasped with 2 curved Kelly  clamps, and the round ligaments on both sides were then identified, suture  ligated, and transected with a Bovie. The ureters were visualized and  peristalsing well beneath the infundibular pelvic ligament. The utero-  ovarian ligaments were then identified, clamped, transected, and suture  ligated and hemostatic. At this point, the uterine artery on the right side  was skeletonized, the bladder flap was created using both sharp and blunt  dissection. This was somewhat difficult due  to distortion of the lower  uterine segment by the large fibroid that was originating off of the right  side. After careful dissection, the bladder was adequately displaced  inferiorly, and the uterine artery on the left side was able to be  skeletonized, doubly clamped, transected, and secured ligated with good  hemostasis. Attention was then turned to the right side. The fibroid was  slightly superior to the uterine artery. The fibroid itself had very  tortuous vessels, and once its blood supply was identified, the blood supply  was clamped, transected, and suture ligated with good hemostasis. The  uterine artery was then very carefully skeletonized. It was coursing beneath  this fibroid, and once it was identified, the uterine artery was clamped,  transected, and suture ligated with good hemostasis. Once the uterine blood  supply was secured, attention was made to amputate the uterine fibroid on  the right given that its size was interfering with good visualization. An  incision was made over the fibroid capsule, and the fibroid was then  carefully  shelled out from the uterus and was sent to pathology, labeled as  uterine fibroid. There was minimal bleeding coming from the fibroid bed.  Prior to removal of the uterine fibroid, the ureter was visualized on the  right side and noted to be coursing well beneath the area of dissection. At  this point, the cardinal ligaments were then clamped, transected, and suture  ligated, and hemostasis was assured. The utero-sacral ligaments were then  clamped, transected, and suture ligated with good hemostasis. At this point,  the uterus and cervix were amputated with the curved hysterectomy scissors  and sent to pathology along with the previously removed uterine fibroid. The  vaginal cuff angles were then secured using a modified Richardson suture,  securing the angles to the utero-sacral and cardinal ligaments. The  remaining portion of the vaginal cuff was closed using interrupted figure-of-  eight Vicryl suture. At this point, the pelvis was irrigated copiously with warm, normal saline. There was a small amount of oozing coming from the  bladder flap that was cauterized with the Bovie cautery. Any other  peritoneal surfaces that were oozing were cauterized as well. The ureters  were both identified and noted to be peristalsing normally and were of  normal caliber. The pelvis was then irrigated copiously with normal saline.  There was evidence of bleeding coming from the right uterine pedicle. This  was grasped with a tonsil clamp and was ligated with Vicryl suture, and  hemostasis was confirmed. The ovaries were then inspected. There was some  oozing coming from the right utero-ovarian pedicle. This was grasped with a  tonsil clamp and was suture ligated and was hemostatic. At this point, the  vaginal cuff, all pedicles, and sutures were inspected and were hemostatic.  The bowel was then unpacked, and the Balfour retractor was removed. The  rectus muscles were inspected, and areas of bleeding  were cauterized with  Bovie. The subfascial areas and peritoneal surfaces were all inspected, and  any areas of bleeding were cauterized with Bovie. Once the sponge and needle  counts were correct, the fascia was closed in a running fashion with Vicryl  suture. Subcutaneous base was irrigated. Any areas of bleeding were  cauterized with Bovie. The depth of the subcutaneous space was less than 2  cm in this area. The skin was then closed with staples. The patient  tolerated the procedure very well. Sponge, needle, and instrument counts  were  all correct x2. At this point, Dr. Bjorn Pippin entered the operating  room to complete his portion of the case which was a Spark suburethral sling  and will be dictated by him. There were no complications.      JW/MEDQ  D:  07/29/2004  T:  07/30/2004  Job:  [id number]

## 2011-01-23 NOTE — H&P (Signed)
NAME:  [name], [name] NO.:  [id number]   MEDICAL RECORD NO.:  [id number]                   PATIENT TYPE:   LOCATION:                                       FACILITY:   PHYSICIAN:  Eugenio Hoes. Tawanna Cooler, M.D. [facility]           DATE OF BIRTH:  07-May-1961   DATE OF ADMISSION:  09/28/2003  DATE OF DISCHARGE:                                HISTORY & PHYSICAL   This 50 year old female comes in the office today accompanied by her husband  for severe abdominal pain.  The patient was seen in the office on September 25, 2003, for evaluation of abdominal pain as part of her physical  examination.  At that time she states she was diagnosed by upper GI 27 years  ago with peptic ulcer disease.  She was treated symptomatically and it went  away.  She has had mid epigastric pain for the past 27 years that comes and  goes.  She says it is dull on a scale of 1/10, at its worst, it is at a 7.  The pain is decreased by Nexium, increased by coffee.  It is worse after  meals.  Her family history is positive, her mother had gallbladder disease.  She had an ultrasound of her gallbladder in 2003 that was normal except for  benign cyst on her right kidney.  At that time, examination was negative.  She was advised to get basic studies including CBC, metabolic panel, H.  pylori and ultrasound of her gallbladder was set up to be done the following  week. She came in on September 28, 2003, because the pain pattern changed in  terms of severity.  She says last night it became constant and it was a 10  on a scale of 1/10 and she was unable to sleep and was up all night.  In the  office, she was seen and examined and subsequently admitted to the hospital  for further evaluation with possible gallbladder disease.   PAST MEDICAL HISTORY:  1. Hospitalized for childbirth x1.  2. T&A.  3. Bilateral tubal ligation as an outpatient.  4. Past illnesses:  None.  5. Injuries:  None.   ALLERGIES:  No  known drug allergies.   MEDICATIONS:  1. Nexium 40 daily.  2. Tenoretic 50/25 one daily for hypertension.  3. Zoloft 100 q.h.s. for depression.  4. Nortriptyline 75 q.h.s. also for depression and fibromyalgia.   HABITS:  She smokes three to five cigarettes a day.  She drinks two to three  glasses of red wine in the evening.   REVIEW OF SYMPTOMS:  Negative except for fibromyalgia.  GYN care by GYN.   SOCIAL HISTORY:  She works Scientist, product/process development at [employer].  She is married  with one child.   FAMILY HISTORY:  Dad died at 39 with pancreatic cancer.  Mother at 33 with  hypertension, diabetes and OA.  One brother in good health except for he has  hypertension.  Half sister is diabetic type 2 controlled with diet.   VACCINATIONS:  Her last tetanus was in 1998.   PHYSICAL EXAMINATION:  GENERAL APPEARANCE:  She is a well-developed, well-  nourished female with acute distress, complaining of severe abdominal pain.  VITAL SIGNS:  Height is 5 feet 6 inches, weight 165, temperature 98, pulse  70 and regular, blood pressure 132/98.  HEENT:  Negative.  NECK:  Supple.  Thyroid is not enlarged.  CHEST:  Clear to auscultation.  CARDIOVASCULAR:  Negative.  ABDOMEN:  Negative.  RECTAL:  Negative.  PELVIC: Negative.  EXTREMITIES:  Normal.  SKIN:  Normal.  VASCULAR:  Peripheral pulses normal.   IMPRESSION:  Acute abdominal pain.   PLAN:  Sent to the emergency room.  In the emergency room, she was seen and  had studies done, all of which were negative except for enlarged uterus. She  was seen in consultation by Dr. Molli Hazard B. Daphine Deutscher, who felt like she did not  have a surgical problem.  He ordered a CT scan, found the enlarged uterus  and possibly enlarged right ovary and referred her for gynecological care.                                               Jeffrey A. Tawanna Cooler, M.D. [facility]    JAT/MEDQ  D:  10/01/2003  T:  10/02/2003  Job:  [id number]   cc:   Rene Paci, M.D. [facility]  [address]  [place], [place] [address]

## 2011-01-23 NOTE — H&P (Signed)
NAME:  [name], SUMAN NO.:  [id number]   MEDICAL RECORD NO.:  [id number]          PATIENT TYPE:  INP   LOCATION:  NA                           FACILITY:  [facility]   PHYSICIAN:  Richardean Sale, M.D.   DATE OF BIRTH:  05-17-1961   DATE OF ADMISSION:  07/29/2004  DATE OF DISCHARGE:                                HISTORY & PHYSICAL   PREOPERATIVE DIAGNOSIS:  Symptomatic uterine fibroids.   HISTORY OF PRESENT ILLNESS:  This is a 50 year old gravida 2, para 1-0-1-1  female who has had progressively worsening menses and dysmenorrhea since  2002.  The patient's periods have become increasingly heavy, and she  experiences frequent episodes of flooding, as well as painful menstrual  cramps that are only marginally improved with over-the-counter pain  medications.  Ultrasound has confirmed the presence of multiple uterine  fibroids, both intermural and submucosal.  The largest fibroid measures  approximately 7 cm in maximum diameter.  The patient presents today for  definitive surgical management.  In addition, she has had worsening symptoms  of incontinence, and has been evaluated by Dr. Bjorn Pippin preoperatively,  and plans to undergo suburethral sling placement at the time of  hysterectomy.   PAST MEDICAL HISTORY:  1.  Hypertension.  2.  Fibromyalgia.  3.  Hiatal hernia.   SURGICAL HISTORY:  1.  Tubal ligation in 2002.  2.  Cholecystectomy in 2003.   GYNECOLOGIC HISTORY:  1.  Symptomatic uterine fibroids.  2.  No history of abnormal Pap smears or sexually transmitted infections.   OBSTETRIC HISTORY:  1.  Miscarriage x1.  2.  Spontaneous vaginal delivery x1.   FAMILY HISTORY:  No known breast, ovarian, uterine, colon cancer.  Significant for diabetes and hypertension.   SOCIAL HISTORY:  Former smoker; quit 1-1/2 years ago.  Occasional alcohol  with dinner.  No illicit drug use.  Married.   MEDICATIONS:  1.  Zoloft.  2.  Atenolol.  3.  Nexium.  4.   Elavil.  5.  Vitamin supplements.   ALLERGIES:  No known drug allergies.   PHYSICAL EXAMINATION:  VITAL SIGNS:  Blood pressure is 112/80, pulse 66,  afebrile, weight 167 pounds, height 5 feet, 1-1/8 inches.  GENERAL:  She is a well-developed, well-nourished, white female in no acute  distress.  HEENT:  Head is normocephalic and atraumatic.  NECK:  Neck and thyroid are within normal limits without masses.  CHEST:  Lungs are clear to auscultation bilaterally.  CARDIOVASCULAR:  Regular rate and rhythm.  ABDOMEN:  Soft, nontender, nondistended.  No palpable masses.  Liver and  spleen are normal.  Previous tubal ligation and gallbladder scars noted.  No  hernia.  EXTREMITIES:  No clubbing, cyanosis, or edema.  Nontender.  PELVIC:  External genitalia within normal limits.  Urethra and urethral  meatus are normal.  Bladder normal to palpation.  Small cystocele noted.  Uterus is mobile, approximately 12 weeks size, enlarged by fibroids.  No  cervical motion tenderness.  Cervix visibly normal.  No palpable adnexal  masses.  RECTOVAGINAL:  Confirms the above exam.  No rectal masses.  Normal rectal  tone noted.   ULTRASOUND:  The uterus measures 12 x 7.2 x 10.3 cm with multiple uterine  fibroids, the largest of which is 7 cm and posterior.  Endometrial thickness  1.7 cm with probable submucosal fibroid.  Both ovaries within normal limits.  Small follicular cysts noted.   Endometrial biopsy performed in October of 2005 was negative for hyperplasia  or malignancy.  The last Pap smear in January 2005 was within normal limits.   ASSESSMENT:  A 50 year old gravida 2, para 1-0-1-1 female with symptomatic  uterine fibroids.  Desires definitive management.   PLAN:  The patient has been counseled on different options for treatment of  uterine fibroids, both medical and surgical.  She has been offered a trial  of Lupron to decrease uterine size and help control bleeding preoperatively,  and the  patient has declined.  Will proceed with attempted laparoscopically-  assisted vaginal hysterectomy and possible total abdominal hysterectomy.  Given the size of the uterus, I reviewed with the patient that it may not be  possible to remove the uterus through the vagina safely, and we may have to  convert to open procedure.  Risks, benefits, and alternatives of both  procedures were reviewed with the patient in detail.  We discussed the risk  of hemorrhage requiring transfusion, infection which could prolong  hospitalization or cause intra-abdominal scarring, injury to the bowel, the  bladder, the ureters, or other intra-abdominal organs which would require  additional surgery, and possibly prolong hospitalization or cause problems  in the future, anesthetic-related complications, cardiac complications, risk  of deep vein thrombosis, pulmonary embolus, and even death.  The patient  voices understanding of all the above risks, and desires to proceed with  surgery.  At completion of hysterectomy, Dr. Bjorn Pippin will perform a  suburethral sling procedure for management of stress urinary incontinence.  All questions have been answered.  Informed consent has been obtained.      JW/MEDQ  D:  07/28/2004  T:  07/29/2004  Job:  [id number]

## 2011-01-23 NOTE — Consult Note (Signed)
NAME:  [name], [name] NO.:  [id number]   MEDICAL RECORD NO.:  [id number]                   PATIENT TYPE:  EMS   LOCATION:  ED                                   FACILITY:  [facility]   PHYSICIAN:  Thornton Park. Daphine Deutscher, M.D.             DATE OF BIRTH:  08-06-1961   DATE OF CONSULTATION:  DATE OF DISCHARGE:                                   CONSULTATION   CHIEF COMPLAINT:  Upper abdominal pain for the last week, worsening today.   HISTORY:  [name] is a 50 year old female seen by Dr. Tawanna Cooler and then  referred to the emergency department for an ultrasound.  I was contacted to  follow up on the patient's ultrasound and see her for her pain.  She has a  history of hypertension, fibromyalgia, depression, prior ulcer.   CURRENT MEDICATIONS:  Zoloft, atenolol, Nexium.   PHYSICAL EXAMINATION:  VITAL SIGNS:  Blood pressure 126/87, temp 99, pulse  60, respirations normal at 18.  GENERAL:  Her abdomen is not exquisitely tender.  She had some vague upper  abdominal pain to palpation although she says it has gotten better.  No  nausea or vomiting.  HEART:  Sinus rhythm without murmurs.  CHEST:  Clear.  ABDOMEN:  As mentioned above and slightly obese.   As a result of my preliminary survey, her ultrasound was reviewed and no  gallstones were seen.  No gallbladder thickening and no abnormalities were  noted.  Because of her discomfort, I ordered a CT scan which I then came  back and reviewed and I went over this with Dr. Rosalie Gums and there was no  evidence of any inflammatory mass in the upper or lower abdomen.  Her  appendix was seen.  No evidence of any stomach thickening or evidence of an  ulcer.  However, the patient does have a large uterus with at least one  fibroid and it is questionable whether her right ovary or uterus on that  side is enlarged.  Because of the size, I spoke with her about follow up  pelvic exam and/or pelvic ultrasound per Dr. Alonza Smoker or  through her  gynecologist.   A laboratory exam drawn showed a hemoglobin of 13.9, a white count of  11,000, amylase was normal at 116, lipase was normal at 35, LFTs were all  within normal limits.   DISPOSITION:  The patient was informed of the need to get pelvic ultrasound  and follow up gynecologic exam through Dr. Tawanna Cooler.  These instructions were  given to her.  She was given a prescription for Phenergan tablets 12.5 mg to  take as needed for nausea.  She is advised to stay on liquids over the  weekend and also to take the Nexium as Dr. Tawanna Cooler had prescribed her.   IMPRESSION:  Abdominal pain etiology uncertain.  CT evidence of either  enlarged uterus or  enlarged uterus and ovary on the right.   PLAN:  A follow up gynecologic evaluation.                                               Thornton Park Daphine Deutscher, M.D.    MBM/MEDQ  D:  09/28/2003  T:  09/29/2003  Job:  [id number]   cc:   [facility]   Tinnie Gens A. Tawanna Cooler, M.D. [facility]

## 2011-01-23 NOTE — Assessment & Plan Note (Signed)
[facility]                                 ON-CALL NOTE   [name], [name]                          MRN:          [id number]  DATE:10/08/2006                            DOB:          03/30/1961    OUTPATIENT PHONE CONSULT.   Phone number is [phone].   OBJECTIVE:  The patient has methicillin resistant Staphylococcus aureus  and has been taking medicine that is affecting her stomach. She was  diagnosed a week ago Tuesday. She was put on Bactrim DS two b.i.d. and  Doxycycline 100 mg b.i.d. for a facial infection that she was told was  MRSA. She is having nausea and has not been able to take all of the  medicine (has not taken a full dose yet). Basically is taking the  Bactrim once a day and has taken Doxycycline once a day. The skin  infection is improving to her eye.   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   PLAN:  At this stage would stop the Bactrim and take only the  Doxycycline one twice a day. It is more likely the reaction is to the  Bactrim.   Call on Monday if the infection does not continue to look like it is  improving.     Arta Silence, MD  Electronically Signed    RNS/MedQ  DD: 10/08/2006  DT: 10/08/2006  Job #: [id number]   cc:   Tinnie Gens A. Tawanna Cooler, MD

## 2011-01-23 NOTE — Op Note (Signed)
NAME:  [name], [name] NO.:  [id number]   MEDICAL RECORD NO.:  [id number]                   PATIENT TYPE:  OBV   LOCATION:  0343                                 FACILITY:  [facility]   PHYSICIAN:  Ollen Gross. Vernell Morgans, M.D.              DATE OF BIRTH:  05/26/1961   DATE OF PROCEDURE:  11/05/2003  DATE OF DISCHARGE:  11/06/2003                                 OPERATIVE REPORT   PREOPERATIVE DIAGNOSIS:  Biliary dyskinesia.   POSTOPERATIVE DIAGNOSIS:  Biliary dyskinesia.   OPERATION/PROCEDURE:  Laparoscopic cholecystectomy with intraoperative  cholangiogram.   SURGEON:  Ollen Gross. Carolynne Edouard, M.D.   ASSISTANT:  Rose Phi. Maple Hudson, M.D.   ANESTHESIA:  General endotracheal anesthesia.   DESCRIPTION OF PROCEDURE:  After informed consent was obtained, the patient  was brought to the operating room and placed in the supine position on the  operating room table.  After adequate induction of general anesthesia, the  patient's abdomen was prepped with Betadine and draped in the usual sterile  manner.  The area below the umbilicus was infiltrated with 0.25% Marcaine.  A small incision was made with the 15-blade knife.  This incision was  carried down through the subcutaneous tissue bluntly with the Kelly clamp  and [employer] retractors until the linea alba was identified.  The linea  alba was incised with the 15-blade knife and each side was grasped with  Kocher clamps and elevated anteriorly.  The preperitoneal space was then  probed bluntly with a hemostat until the peritoneum was opened and access  was gained to the abdominal cavity.  A 0 Vicryl pursestring stitch was  placed in the fascia surrounding the opening. The Hasson cannula was placed  through the opening and anchored in place with the previously placed Vicryl  pursestring stitch.  The abdomen was then insufflated with carbon dioxide  without difficulty.  The patient was placed in the head-up position.  The  laparoscope was placed through the Hasson cannula and the right upper  quadrant was inspected.  The dome of the gallbladder and liver were readily  identified.  Next, the epigastric region was then infiltrated with 0.25%  Marcaine.  A small incision was made with the 15-blade knife and a 10 mm  port was placed bluntly through this incision into the abdominal cavity  under direct vision.  Sites were then chosen on the right side of the  abdomen with placement of a 5 mm ports.  Each of these areas was infiltrated  with 0.25% Marcaine.  Small stab incisions were made with 15-blade knife and  5 mm ports were placed bluntly through these incisions into the abdominal  cavity under direct vision.  A blunt grasper was placed through the lateral-  most 5 mm port and used to grasp the dome of the gallbladder and elevate it  anteriorly and superiorly.  Another blunt grasper was placed  through the  other 5 mm port and used to retract on the body and neck of the gallbladder.  The dissector was placed through the epigastric port and using the  electrocautery, the peritoneal reflection at the gallbladder neck was  opened.  Blunt dissection was then carried out in this area until the  gallbladder neck-cystic duct junction was readily identified and a good  window was created.  A single clip was placed distally on the gallbladder  neck.  A small ductotomy was made with the laparoscopic scissors.  A 14-  gauge angiocath was placed percutaneously through the anterior abdominal  wall.  A Reddick cholangiogram catheter was placed through the angiocath and  flushed.  The Reddick catheter was then placed within the cystic duct and  anchored in place with a clip.  A cholangiogram was obtained that showed no  filling defects, good emptying into the duodenum and good length on the  cystic duct.  The anchoring clip and catheter were then removed from the  patient.  Three clips were placed proximally on the cystic duct  and the duct  was divided between the two sets of clips.  Posterior to this the cystic  artery are identified and again dissected bluntly in a circumferential  manner until a good window was created.  Two clips were placed proximally  and one distally on the artery and the artery was divided between the two.  Next, the laparoscopic hook cautery device was used to separate the  gallbladder from the liver bed.  Prior to completely detaching the  gallbladder from the liver bed, the liver bed was inspected and several  small bleeding points were coagulated with the electrocautery until the bed  was completely hemostatic.  The gallbladder was then detached the rest of  the way from the liver bed with the hook electrocautery without difficulty.  The laparoscopic was then moved to the upper gastric port and the  gallbladder grasper was placed through the Hasson cannula and used to grasp  the neck of the gallbladder.  The gallbladder with the Hasson cannula were  removed through the infraumbilical port without difficulty.  The fascial  defect was then closed with the previously placed 0 Vicryl pursestring  stitch as well as with another interrupted 0 Vicryl stitch.  The liver bed  was inspected again and found to be hemostatic.  The abdomen was then  irrigated with copious amounts of saline until the effluent was clear.  The  ports were then all removed under direct vision and found to be hemostatic.  The gas was allowed to escape.  The skin incisions were all closed with  interrupted 4-0 Monocryl subcuticular stitches.  Benzoin and Steri-Strips  were applied.  The patient tolerated the procedure well.  At the end of the  case all needle, sponge and instrument counts were correct.  The patient was  awakened and taken to the recovery room in stable condition.                                               Ollen Gross. Vernell Morgans, M.D.   PST/MEDQ  D:  11/07/2003  T:  11/08/2003  Job:  [id number]

## 2011-06-01 LAB — COMPREHENSIVE METABOLIC PANEL
ALT: 30
AST: 30
Alkaline Phosphatase: 37 — ABNORMAL LOW
CO2: 28
Calcium: 9.3
Chloride: 102
GFR calc Af Amer: >60
GFR calc non Af Amer: >60
Glucose, Bld: 97
Potassium: 2.9 — ABNORMAL LOW
Sodium: 137

## 2011-06-01 LAB — POCT CARDIAC MARKERS
CKMB, poc: <1 — ABNORMAL LOW
CKMB, poc: <1 — ABNORMAL LOW
Myoglobin, poc: 25.7
Operator id: 277751
Operator id: 277751
Troponin i, poc: <0.05
Troponin i, poc: <0.05

## 2011-06-01 LAB — BASIC METABOLIC PANEL
CO2: 28
Calcium: 9.1
GFR calc Af Amer: >60
GFR calc non Af Amer: >60
Potassium: 3.7
Sodium: 138

## 2011-06-01 LAB — I-STAT 8, (EC8 V) (CONVERTED LAB)
BUN: 13
Bicarbonate: 22.5
Glucose, Bld: 106 — ABNORMAL HIGH
Hemoglobin: 14.6
Sodium: 135
TCO2: 24
pCO2, Ven: 37.2 — ABNORMAL LOW

## 2011-06-01 LAB — RAPID URINE DRUG SCREEN, HOSP PERFORMED
Amphetamines: NOT DETECTED
Barbiturates: NOT DETECTED
Opiates: NOT DETECTED

## 2011-06-01 LAB — CARDIAC PANEL(CRET KIN+CKTOT+MB+TROPI)
CK, MB: 0.9
Relative Index: 0.9
Total CK: 100

## 2011-06-01 LAB — LIPID PANEL
Cholesterol: 172
HDL: 40

## 2011-06-01 LAB — CBC
Hemoglobin: 12.9
MCHC: 33.1
RBC: 4.3
WBC: 6

## 2011-06-01 LAB — POCT I-STAT CREATININE: Operator id: 261381

## 2014-12-26 ENCOUNTER — Encounter: Payer: Self-pay | Admitting: Gastroenterology

## 2021-04-07 ENCOUNTER — Other Ambulatory Visit: Payer: Self-pay

## 2021-04-07 ENCOUNTER — Ambulatory Visit (INDEPENDENT_AMBULATORY_CARE_PROVIDER_SITE_OTHER): Payer: Managed Care, Other (non HMO) | Admitting: Podiatry

## 2021-04-07 ENCOUNTER — Encounter: Payer: Self-pay | Admitting: Podiatry

## 2021-04-07 DIAGNOSIS — L600 Ingrowing nail: Secondary | ICD-10-CM

## 2021-04-07 NOTE — Progress Notes (Signed)
Subjective:   Patient ID: [name], female   DOB: 60 y.o.   MRN: [id number]   HPI Patient presents with a painful ingrown toenail of the right big toe stating that she is trying to trim it and work on it and cannot and she does have diabetes that is under good control but she gets scared to work on things herself.  Patient does not smoke likes to be active   Review of Systems  All other systems reviewed and are negative.      Objective:  Physical Exam Vitals and nursing note reviewed.  Constitutional:      Appearance: She is well-developed.  Pulmonary:     Effort: Pulmonary effort is normal.  Musculoskeletal:        General: Normal range of motion.  Skin:    General: Skin is warm.  Neurological:     Mental Status: She is alert.    Neurovascular status intact muscle strength found to be adequate range of motion adequate with an incurvated right hallux lateral border painful when pressed slight redness no active drainage noted.  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  Patient will leave dressing on 24 hours take off earlier if any throbbing were to occur and is encouraged to call questions concerns

## 2021-04-07 NOTE — Patient Instructions (Signed)

## 2022-07-24 ENCOUNTER — Encounter: Payer: Self-pay | Admitting: Podiatry

## 2022-07-24 ENCOUNTER — Ambulatory Visit (INDEPENDENT_AMBULATORY_CARE_PROVIDER_SITE_OTHER): Payer: Managed Care, Other (non HMO)

## 2022-07-24 ENCOUNTER — Ambulatory Visit (INDEPENDENT_AMBULATORY_CARE_PROVIDER_SITE_OTHER): Payer: Managed Care, Other (non HMO) | Admitting: Podiatry

## 2022-07-24 DIAGNOSIS — M205X2 Other deformities of toe(s) (acquired), left foot: Secondary | ICD-10-CM

## 2022-07-24 DIAGNOSIS — M7752 Other enthesopathy of left foot: Secondary | ICD-10-CM

## 2022-07-24 DIAGNOSIS — M79675 Pain in left toe(s): Secondary | ICD-10-CM

## 2022-07-24 MED ORDER — TRIAMCINOLONE ACETONIDE 10 MG/ML IJ SUSP
10.0000 mg | Freq: Once | INTRAMUSCULAR | Status: AC
  Administered 2022-07-24: 10 mg

## 2022-07-27 NOTE — Progress Notes (Signed)
Subjective:   Patient ID: [name], female   DOB: 61 y.o.   MRN: [id number]   HPI Patient presents with a 45-month history of a lot of pain around the left big toe joint stating she does not remember injury.  States it is sore with pressure shoe gear and activity   ROS      Objective:  Physical Exam  Neuro vascular status intact with inflammation pain around the first MPJ left both on the lateral dorsal surface with moderate reduction motion in a functional stance no crepitus of the joint     Assessment:  Probability for inflammatory capsulitis with possibility for hallux limitus more functional slightly structural first MPJ     Plan:  H&P reviewed condition and went ahead and educated her on condition.  Today I did sterile prep and I injected around the first MPJ 3 mg dexamethasone Kenalog 5 mg Xylocaine periarticular I advised on rigid bottom shoes reappoint to recheck  X-rays indicate slight narrowness of the joint first MPJ left lateral side no indications currently of significant dorsal spurring moderate elevation first metatarsal segment

## 2022-07-28 ENCOUNTER — Other Ambulatory Visit: Payer: Self-pay | Admitting: Podiatry

## 2022-07-28 DIAGNOSIS — M205X2 Other deformities of toe(s) (acquired), left foot: Secondary | ICD-10-CM

## 2022-07-28 DIAGNOSIS — M7752 Other enthesopathy of left foot: Secondary | ICD-10-CM

## 2022-08-14 ENCOUNTER — Ambulatory Visit: Payer: Managed Care, Other (non HMO) | Admitting: Podiatry

## 2024-05-12 NOTE — Progress Notes (Signed)
 Referral and office notes sent  to sleep medicine for Inspire activation. Faxed to Attn: Alissa with [facility]. Fax#:  [phone].

## 2024-05-15 ENCOUNTER — Other Ambulatory Visit: Payer: Self-pay

## 2024-05-15 ENCOUNTER — Emergency Department (HOSPITAL_BASED_OUTPATIENT_CLINIC_OR_DEPARTMENT_OTHER)
Admission: EM | Admit: 2024-05-15 | Discharge: 2024-05-16 | Disposition: A | Discharge status: Home / Self Care | Attending: Emergency Medicine | Admitting: Emergency Medicine

## 2024-05-15 ENCOUNTER — Encounter (HOSPITAL_BASED_OUTPATIENT_CLINIC_OR_DEPARTMENT_OTHER): Payer: Self-pay | Admitting: Emergency Medicine

## 2024-05-15 DIAGNOSIS — R1110 Vomiting, unspecified: Secondary | ICD-10-CM | POA: Insufficient documentation

## 2024-05-15 DIAGNOSIS — R42 Dizziness and giddiness: Secondary | ICD-10-CM | POA: Diagnosis present

## 2024-05-15 HISTORY — DX: Essential (primary) hypertension: I10

## 2024-05-15 HISTORY — DX: Type 2 diabetes mellitus without complications: E11.9

## 2024-05-15 LAB — COMPREHENSIVE METABOLIC PANEL WITH GFR
ALT: 63 U/L — ABNORMAL HIGH (ref 0–44)
AST: 39 U/L (ref 15–41)
Albumin: 4.1 g/dL (ref 3.5–5.0)
Alkaline Phosphatase: 82 U/L (ref 38–126)
Anion gap: 13 (ref 5–15)
BUN: 14 mg/dL (ref 8–23)
CO2: 24 mmol/L (ref 22–32)
Calcium: 9.3 mg/dL (ref 8.9–10.3)
Chloride: 101 mmol/L (ref 98–111)
Creatinine, Ser: 0.89 mg/dL (ref 0.44–1.00)
GFR, Estimated: >60 mL/min (ref >60)
Glucose, Bld: 114 mg/dL — ABNORMAL HIGH (ref 70–99)
Potassium: 3.4 mmol/L — ABNORMAL LOW (ref 3.5–5.1)
Sodium: 138 mmol/L (ref 135–145)
Total Bilirubin: 0.4 mg/dL (ref 0.0–1.2)
Total Protein: 7.1 g/dL (ref 6.5–8.1)

## 2024-05-15 LAB — CBC
HCT: 37.9 % (ref 36.0–46.0)
Hemoglobin: 12.2 g/dL (ref 12.0–15.0)
MCH: 29.2 pg (ref 26.0–34.0)
MCHC: 32.2 g/dL (ref 30.0–36.0)
MCV: 90.7 fL (ref 80.0–100.0)
Platelets: 285 K/uL (ref 150–400)
RBC: 4.18 MIL/uL (ref 3.87–5.11)
RDW: 12.7 % (ref 11.5–15.5)
WBC: 10 K/uL (ref 4.0–10.5)
nRBC: 0 % (ref 0.0–0.2)

## 2024-05-15 MED ORDER — DIAZEPAM 5 MG/ML IJ SOLN
2.5000 mg | Freq: Once | INTRAMUSCULAR | Status: AC
  Administered 2024-05-15: 2.5 mg via INTRAVENOUS
  Filled 2024-05-15: qty 2

## 2024-05-15 MED ORDER — PROCHLORPERAZINE EDISYLATE 10 MG/2ML IJ SOLN
10.0000 mg | Freq: Once | INTRAMUSCULAR | Status: AC
  Administered 2024-05-15: 10 mg via INTRAVENOUS
  Filled 2024-05-15: qty 2

## 2024-05-15 NOTE — ED Triage Notes (Addendum)
 Pt reports vertigo since 1500 today.  Tried to take home meclizine  but vomitted immediately after.  Given po zofran by ems appx 1830 today.    Pt reports difficulty opening eyes due to severity of vertigo. Pt reports having Inspire CPAP device implanted Friday 9/5.

## 2024-05-15 NOTE — ED Notes (Signed)
 Pt informed of need for UA, v/u, states she cannot void at this moment, will ctm

## 2024-05-15 NOTE — ED Provider Notes (Signed)
  EMERGENCY DEPARTMENT AT [facility] Provider Note   CSN: [id number] Arrival date & time: 05/15/24  1941     Patient presents with: Dizziness   [name] is a 63 y.o. female who presents emergency department with chief complaint of vertigo.  Patient reports she has a history of recurrent episodes of vertigo.  She reports that she has only had to go to the emergency department 1 other time but her symptoms today had been severe and lasted longer than normal.  She reports she had sudden onset of room spinning dizziness, ataxia and vomiting starting around 3 PM tonight.  She denies any ringing in her ears that is new.  Patient states that her symptoms are much worse when she tries to open her eyes better when she keeps her eyes shut.    Dizziness      Prior to Admission medications   Medication Sig Start Date End Date Taking? Authorizing Provider  albuterol (VENTOLIN HFA) 108 (90 Base) MCG/ACT inhaler Inhale 2 puffs every 4-6 hours as needed 07/02/20   [provider]  Atenolol (TENORMIN PO) atenolol    [provider]  Calcium-Vitamin D-Vitamin K (CALCIUM SOFT CHEWS) 500-500-40 MG-UNT-MCG CHEW Chew 1 tablet by mouth daily.    [provider]  FLUoxetine (PROZAC) 20 MG capsule Take 20 mg by mouth daily. 02/26/21   [provider]  fluticasone-salmeterol (ADVAIR DISKUS) 250-50 MCG/ACT AEPB 1 puff 2 (two) times daily. 08/28/20   [provider]  HYDROcodone-acetaminophen (NORCO/VICODIN) 5-325 MG tablet  04/11/18   [provider]  JANUVIA 50 MG tablet Take 50 mg by mouth daily. 02/13/21   [provider]  montelukast (SINGULAIR) 10 MG tablet Take by mouth. 08/05/20   [provider]  rosuvastatin (CRESTOR) 20 MG tablet Take 20 mg by mouth at bedtime. 02/13/21   [provider]  topiramate (TOPAMAX) 50 MG tablet Take 50 mg by mouth daily. 01/22/21   [provider]  traZODone (DESYREL)  50 MG tablet Take 50 mg by mouth at bedtime. 02/13/21   [provider]  triamcinolone  (NASACORT ) 55 MCG/ACT AERO nasal inhaler as needed. 10/15/17   [provider]  venlafaxine XR (EFFEXOR-XR) 150 MG 24 hr capsule  01/14/17   [provider]    Allergies: Ciprofloxacin    Review of Systems  Neurological:  Positive for dizziness.    Updated Vital Signs BP (!) 140/92   Pulse 66   Temp (!) 97.5 F (36.4 C)   Resp 15   Ht 5' 1.25 (1.556 m)   Wt 73.9 kg   SpO2 97%   BMI 30.55 kg/m   Physical Exam Vitals and nursing note reviewed.  Constitutional:      General: She is not in acute distress.    Appearance: She is well-developed. She is not diaphoretic.  HENT:     Head: Normocephalic and atraumatic.     Right Ear: External ear normal.     Left Ear: External ear normal.     Nose: Nose normal.     Mouth/Throat:     Mouth: Mucous membranes are moist.  Eyes:     General: No visual field deficit or scleral icterus.    Extraocular Movements: Extraocular movements intact.     Conjunctiva/sclera: Conjunctivae normal.     Pupils: Pupils are equal, round, and reactive to light.     Comments: Nytagmus R lateral only  Cardiovascular:     Rate and Rhythm: Normal rate  and regular rhythm.     Heart sounds: Normal heart sounds. No murmur heard.    No friction rub. No gallop.  Pulmonary:     Effort: Pulmonary effort is normal. No respiratory distress.     Breath sounds: Normal breath sounds.  Abdominal:     General: Bowel sounds are normal. There is no distension.     Palpations: Abdomen is soft. There is no mass.     Tenderness: There is no abdominal tenderness. There is no guarding.  Musculoskeletal:     Cervical back: Normal range of motion.  Skin:    General: Skin is warm and dry.  Neurological:     Mental Status: She is alert and oriented to person, place, and time.     GCS: GCS eye subscore is 4. GCS verbal subscore is 5. GCS motor subscore is 6.      Cranial Nerves: No dysarthria or facial asymmetry.     Sensory: Sensation is intact.     Motor: Motor function is intact.     Coordination: Finger-Nose-Finger Test and Heel to [place] Test normal. Rapid alternating movements normal.     Deep Tendon Reflexes: Reflexes are normal and symmetric.     Comments: Unable to ambulate without assistance due to disequilibrium  Psychiatric:        Behavior: Behavior normal.    (all labs ordered are listed, but only abnormal results are displayed) Labs Reviewed  COMPREHENSIVE METABOLIC PANEL WITH GFR - Abnormal; Notable for the following components:      Result Value   Potassium 3.4 (*)    Glucose, Bld 114 (*)    ALT 63 (*)    All other components within normal limits  CBC  URINALYSIS, ROUTINE W REFLEX MICROSCOPIC    EKG: None  Radiology: No results found.   Procedures   Medications Ordered in the ED  prochlorperazine  (COMPAZINE ) injection 10 mg (10 mg Intravenous Given 05/15/24 2255)    Clinical Course as of 05/21/24 1111  Tue May 16, 2024  0035 Patient reevaluated at bedside after receiving Compazine  and Valium .  She is now able to keep her eyes open however we attempted ambulation and her disequilibrium is too severe to ambulate without single person assistance.  Patient was able to sit back down.  This did not worsen her vertigo symptoms temporarily.  At this time given signout to Dr. Roselyn.  Will let the patient rest and reevaluate.  She may need admission due to the severity of her symptoms.  She understands that this is a potential. [AH]  0157 Patient feeling much better after meclizine , able to ambulate without difficulty to bathroom. Ready to go home. PCP follow up, RTED for any other concerns.   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 Patient's exam is consistent with peripheral  vertigo. Pt treated with valium  and compazine  with improvement but continued ataxia.  No suspicion for central cause  Labs reassuring  S/o at shift change  given to Dr. Roselyn, may need admission if sxs do not improve.  Amount and/or Complexity of Data Reviewed Labs: ordered.  Risk Prescription drug management.        Final diagnoses:  None  ED Discharge Orders     None          Arloa Chroman, PA-C 05/21/24 1127    Roselyn Carlin NOVAK, MD 06/02/24 [phone]

## 2024-05-16 MED ORDER — MECLIZINE HCL 25 MG PO TABS
25.0000 mg | ORAL_TABLET | Freq: Once | ORAL | Status: AC
  Administered 2024-05-16: 25 mg via ORAL
  Filled 2024-05-16: qty 1

## 2024-05-16 NOTE — ED Provider Notes (Signed)
 Care assumed at shift change. Here for vertigo, history of same. Recheck after meds.  Physical Exam  BP 133/79   Pulse 70   Temp 97.7 F (36.5 C) (Oral)   Resp 16   Ht 5' 1.25 (1.556 m)   Wt 73.9 kg   SpO2 95%   BMI 30.55 kg/m   Physical Exam  Procedures  Procedures  ED Course / MDM   Clinical Course as of 05/16/24 0158  Tue May 16, 2024  0035 Patient reevaluated at bedside after receiving Compazine  and Valium .  She is now able to keep her eyes open however we attempted ambulation and her disequilibrium is too severe to ambulate without single person assistance.  Patient was able to sit back down.  This did not worsen her vertigo symptoms temporarily.  At this time given signout to Dr. Roselyn.  Will let the patient rest and reevaluate.  She may need admission due to the severity of her symptoms.  She understands that this is a potential. [AH]  0157 Patient feeling much better after meclizine , able to ambulate without difficulty to bathroom. Ready to go home. PCP follow up, RTED for any other concerns.   [CS]    Clinical Course User Index [AH] Harris, Abigail, PA-C [CS] Roselyn Carlin NOVAK, MD   Medical Decision Making Problems Addressed: Vertigo: acute illness or injury  Risk Prescription drug management.          Roselyn Carlin NOVAK, MD 05/16/24 [phone]
